# Patient Record
Sex: Male | Born: 1975 | Race: Black or African American | Hispanic: No | Marital: Single | Smoking: Never smoker
Health system: Southern US, Community
[De-identification: ages and names within clinical notes are randomized; demographics above are authoritative.]

## PROBLEM LIST (undated history)

## (undated) DIAGNOSIS — M7730 Calcaneal spur, unspecified foot: Secondary | ICD-10-CM

## (undated) DIAGNOSIS — L309 Dermatitis, unspecified: Secondary | ICD-10-CM

## (undated) HISTORY — DX: Dermatitis, unspecified: L30.9

## (undated) HISTORY — DX: Calcaneal spur, unspecified foot: M77.30

---

## 2003-02-04 ENCOUNTER — Emergency Department (HOSPITAL_COMMUNITY): Admission: EM | Admit: 2003-02-04 | Discharge: 2003-02-05 | Payer: Self-pay | Admitting: Emergency Medicine

## 2012-06-28 ENCOUNTER — Ambulatory Visit (INDEPENDENT_AMBULATORY_CARE_PROVIDER_SITE_OTHER): Payer: 59 | Admitting: Physician Assistant

## 2012-06-28 VITALS — BP 168/88 | HR 114 | Temp 98.3°F | Resp 16 | Ht 74.0 in | Wt 266.0 lb

## 2012-06-28 DIAGNOSIS — L708 Other acne: Secondary | ICD-10-CM

## 2012-06-28 DIAGNOSIS — L2089 Other atopic dermatitis: Secondary | ICD-10-CM

## 2012-06-28 DIAGNOSIS — L709 Acne, unspecified: Secondary | ICD-10-CM | POA: Insufficient documentation

## 2012-06-28 DIAGNOSIS — L730 Acne keloid: Secondary | ICD-10-CM | POA: Insufficient documentation

## 2012-06-28 DIAGNOSIS — L209 Atopic dermatitis, unspecified: Secondary | ICD-10-CM | POA: Insufficient documentation

## 2012-06-28 DIAGNOSIS — L910 Hypertrophic scar: Secondary | ICD-10-CM

## 2012-06-28 MED ORDER — TRIAMCINOLONE ACETONIDE 0.5 % EX OINT: TOPICAL_OINTMENT | Freq: Two times a day (BID) | CUTANEOUS | Status: AC

## 2012-06-28 MED ORDER — DOXYCYCLINE HYCLATE 100 MG PO CAPS: 100.0000 mg | ORAL_CAPSULE | Freq: Two times a day (BID) | ORAL | Status: AC

## 2012-06-28 NOTE — Patient Instructions (Signed)
Drink at least 64 ounces of water daily. Consider a humidifier for the room where you sleep. Bathe once daily. Avoid using HOT water, as it dries skin. Avoid deodorant soaps (Dial is the worst!) and stick with gentle cleansers (I like Cetaphil Liquid Cleanser). After bathing, dry off completely, then apply a thick emollient cream (I like Cetaphil Moisturizing Cream). Apply the cream twice daily, or more!  Take an oral antihistamine daily (Claritin or Allegra or Zyrtec are good choices)

## 2012-06-28 NOTE — Progress Notes (Signed)
  Subjective:    Patient ID: Jay Duran, male    DOB: Jun 23, 1976, 36 y.o.   MRN: [id number]  HPI This 36 y.o. Male presents for evaluation of a keloid on the back of his head and flare of eczema on the abdomen ("which it normally does this time of year").    The keloid has been present over 10 years. Injections were not beneficial. It has been getting larger, and now causes intermittent pain.  Ready to have it removed.  OTC cream for eczema was not helpful for the itchy rash on the abdomen.  Bathes TID (in the morning before work, in the afternoon after exercise, and then after the second part of his work day).  Doesn't use very hot water, uses Dove or [place] brand soap.  No moisturizer.  Review of Systems As above.  History reviewed. No pertinent past medical history.  History reviewed. No pertinent past surgical history.  Prior to Admission medications   Not on File    No Known Allergies  History   Social History  . Marital Status: Single    Spouse Name: N/A    Number of Children: 0  . Years of Education: 14   Occupational History  . Customer Service Representative    Social History Main Topics  . Smoking status: Never Smoker   . Smokeless tobacco: Never Used  . Alcohol Use: No  . Drug Use: No  . Sexually Active: Yes -- Male partner(s)    Birth Control/ Protection: Condom   Family History  Problem Relation Age of Onset  . Hypertension Mother   . Asthma Mother   . Hypertension Sister   . Hypertension Brother   . Hypertension Sister        Objective:   Physical Exam  Blood pressure 168/88, pulse 114, temperature 98.3 F (36.8 C), temperature source Oral, resp. rate 16, height 6\' 2"  (1.88 m), weight 266 lb (120.657 kg), SpO2 99.00%. Body mass index is 34.15 kg/(m^2). Well-developed, well nourished BM who is awake, alert and oriented, in NAD. HEENT: [place]/AT, sclera and conjunctiva are clear.   Neck: supple, non-tender, no lymphadenopathy,  thyromegaly. Heart: RRR, no murmur Lungs: CTA Skin: warm and dry. Posterior scalp with large keloid scattered with pustular lesions.  Mildly tender on palpation.  Trunk is covered with plaques or hyperpigmented scaled lesions, many of which are coalesced into very large lesions.  Pustulopapular acne also noted on the face.     Assessment & Plan:   1. Folliculitis keloidalis nuchae  doxycycline (VIBRAMYCIN) 100 MG capsule  2. Keloid of skin  Ambulatory referral to Plastic Surgery  3. Atopic eczema  triamcinolone ointment (KENALOG) 0.5 %   Patient Instructions  Drink at least 64 ounces of water daily. Consider a humidifier for the room where you sleep. Bathe once daily. Avoid using HOT water, as it dries skin. Avoid deodorant soaps (Dial is the worst!) and stick with gentle cleansers (I like Cetaphil Liquid Cleanser). After bathing, dry off completely, then apply a thick emollient cream (I like Cetaphil Moisturizing Cream). Apply the cream twice daily, or more!  Take an oral antihistamine daily (Claritin or Allegra or Zyrtec are good choices)  RTC 4 weeks, sooner prn Consider oral prednisone and/or referral to dermatology at that time.

## 2012-07-01 NOTE — Progress Notes (Signed)
Left message for pt to call to schedule 1 month f-up appt. Jay Duran

## 2012-07-09 NOTE — Progress Notes (Signed)
Sent pt reminder letter to schedule 1 month f-up from 06/28/12 visit.

## 2013-12-23 ENCOUNTER — Encounter: Payer: Self-pay | Admitting: Medical

## 2013-12-23 ENCOUNTER — Ambulatory Visit (INDEPENDENT_AMBULATORY_CARE_PROVIDER_SITE_OTHER): Payer: 59 | Admitting: Medical

## 2013-12-23 VITALS — BP 132/80 | HR 74 | Temp 98.1°F | Wt 262.0 lb

## 2013-12-23 DIAGNOSIS — M542 Cervicalgia: Secondary | ICD-10-CM

## 2013-12-23 DIAGNOSIS — M436 Torticollis: Secondary | ICD-10-CM

## 2013-12-23 MED ORDER — CYCLOBENZAPRINE HCL 10 MG PO TABS
ORAL_TABLET | ORAL | Status: DC
Start: 2013-12-23 — End: 2014-01-20

## 2013-12-23 MED ORDER — CELECOXIB 200 MG PO CAPS
200.0000 mg | ORAL_CAPSULE | Freq: Two times a day (BID) | ORAL | Status: DC
Start: 2013-12-23 — End: 2014-01-20

## 2013-12-23 NOTE — Progress Notes (Signed)
Subjective: Here as a new patient today for complaint of neck pain.  No recent primary care, has been in good state of health, no significant prior medical problems.  2 weeks ago he recalls rolling over in bed for a crackle in his neck and the next morning his neck was stiff. Since then he has had some neck pain and stiffness, some decreased range of motion. He denies arm pain, numbness, tingling, weakness, no fever, no recent illness, recent injury, trauma.  He does exercise, plays basketball. He has used some heat, ice pack, massage chair, and this has helped some. No other aggravating or relieving factors.  History reviewed. No pertinent past medical history.  Review of systems as in subjective:  Objective: General: Well-developed, well-nourished, alert, no acute distress Skin: No erythema or ecchymosis Neck:  Range of motion is somewhat decreased in all directions, mild posterior tenderness at the base of the neck, but no lymphadenopathy, no mass no thyromegaly Back: Nontender MSK: Upper extremities non-tender, no deformity, normal range of motion Upper extremities neurovascularly intact   Assessment: Encounter Diagnoses  Name Primary?  . Torticollis Yes  . Cervicalgia     Plan: Discussed his symptoms and exam suggest torticollis versus muscle spasm of the neck versus strain.  Gave a few samples of Celebrex to use for pain and inflammation.  Flexeril to use when necessary, discussed possible sedation. Discussed routine stretching and range of motion exercises. Consider massage, can use heat when necessary. Call or return if not improving within the next week.

## 2013-12-23 NOTE — Patient Instructions (Signed)
Diagnosis: neck strain vs neck spasm vs torticollis  Recommendations: For the next 1-2 weeks,  Use heat as needed   Celebrex 200mg  antiinflammatory, 1 daily with food in the morning  Begin muscle relaxer at bedtime for a few days, then as needed.  Flexeril 10mg , 1/2- 1 tablet at bedtime only  Consider massage  Do daily stretches as we discussed  This should resolve within a week or so.   If not resolving then let me know.  Massage Therapy:  Sharen HintJanet Blevins [facility] [address] [place], [place] [address] [phone] [email or web address]   [facility] [address] [place], [place] [id number] [phone]  Torticollis, Acute You have suddenly (acutely) developed a twisted neck (torticollis). This is usually a self-limited condition. CAUSES  Acute torticollis may be caused by malposition, trauma or infection. Most commonly, acute torticollis is caused by sleeping in an awkward position. Torticollis may also be caused by the flexion, extension or twisting of the neck muscles beyond their normal position. Sometimes, the exact cause may not be known. SYMPTOMS  Usually, there is pain and limited movement of the neck. Your neck may twist to one side. DIAGNOSIS  The diagnosis is often made by physical examination. X-rays, CT scans or MRIs may be done if there is a history of trauma or concern of infection. TREATMENT  For a common, stiff neck that develops during sleep, treatment is focused on relaxing the contracted neck muscle. Medications (including shots) may be used to treat the problem. Most cases resolve in several days. Torticollis usually responds to conservative physical therapy. If left untreated, the shortened and spastic neck muscle can cause deformities in the face and neck. Rarely, surgery is required. HOME CARE INSTRUCTIONS   Use over-the-counter and prescription medications as directed by your  caregiver.  Do stretching exercises and massage the neck as directed by your caregiver.  Follow up with physical therapy if needed and as directed by your caregiver. SEEK IMMEDIATE MEDICAL CARE IF:   You develop difficulty breathing or noisy breathing (stridor).  You drool, develop trouble swallowing or have pain with swallowing.  You develop numbness or weakness in the hands or feet.  You have changes in speech or vision.  You have problems with urination or bowel movements.  You have difficulty walking.  You have a fever.  You have increased pain. MAKE SURE YOU:   Understand these instructions.  Will watch your condition.  Will get help right away if you are not doing well or get worse. Document Released: 11/10/2000 Document Revised: 02/05/2012 Document Reviewed: 12/22/2009 [facility] Patient Information 2014 [place], [place].

## 2014-01-20 ENCOUNTER — Encounter: Payer: Self-pay | Admitting: Medical

## 2014-01-20 ENCOUNTER — Ambulatory Visit (INDEPENDENT_AMBULATORY_CARE_PROVIDER_SITE_OTHER): Payer: 59 | Admitting: Medical

## 2014-01-20 VITALS — BP 140/90 | HR 72 | Temp 98.1°F | Resp 18 | Ht 74.0 in | Wt 267.0 lb

## 2014-01-20 DIAGNOSIS — Q799 Congenital malformation of musculoskeletal system, unspecified: Secondary | ICD-10-CM

## 2014-01-20 DIAGNOSIS — M79676 Pain in unspecified toe(s): Secondary | ICD-10-CM

## 2014-01-20 DIAGNOSIS — K921 Melena: Secondary | ICD-10-CM

## 2014-01-20 DIAGNOSIS — R102 Pelvic and perineal pain: Secondary | ICD-10-CM

## 2014-01-20 DIAGNOSIS — R030 Elevated blood-pressure reading, without diagnosis of hypertension: Secondary | ICD-10-CM

## 2014-01-20 DIAGNOSIS — Z113 Encounter for screening for infections with a predominantly sexual mode of transmission: Secondary | ICD-10-CM

## 2014-01-20 DIAGNOSIS — K036 Deposits [accretions] on teeth: Secondary | ICD-10-CM

## 2014-01-20 DIAGNOSIS — M898X9 Other specified disorders of bone, unspecified site: Secondary | ICD-10-CM

## 2014-01-20 DIAGNOSIS — Z0000 Encounter for general adult medical examination without abnormal findings: Secondary | ICD-10-CM

## 2014-01-20 DIAGNOSIS — M79609 Pain in unspecified limb: Secondary | ICD-10-CM

## 2014-01-20 LAB — HIV ANTIBODY (ROUTINE TESTING W REFLEX): HIV: NONREACTIVE

## 2014-01-20 LAB — POCT URINALYSIS DIPSTICK
Bilirubin, UA: NEGATIVE
GLUCOSE UA: NEGATIVE
Ketones, UA: NEGATIVE
Leukocytes, UA: NEGATIVE
Nitrite, UA: NEGATIVE
RBC UA: NEGATIVE
SPEC GRAV UA: 1.025
UROBILINOGEN UA: NEGATIVE
pH, UA: 5

## 2014-01-20 LAB — CBC
HEMATOCRIT: 36 % — AB (ref 39.0–52.0)
HEMOGLOBIN: 11.9 g/dL — AB (ref 13.0–17.0)
MCH: 26.5 pg (ref 26.0–34.0)
MCHC: 33.1 g/dL (ref 30.0–36.0)
MCV: 80.2 fL (ref 78.0–100.0)
Platelets: 290 10*3/uL (ref 150–400)
RBC: 4.49 MIL/uL (ref 4.22–5.81)
RDW: 15.1 % (ref 11.5–15.5)
WBC: 5.8 10*3/uL (ref 4.0–10.5)

## 2014-01-20 LAB — COMPREHENSIVE METABOLIC PANEL
ALBUMIN: 4 g/dL (ref 3.5–5.2)
ALK PHOS: 50 U/L (ref 39–117)
ALT: 16 U/L (ref 0–53)
AST: 17 U/L (ref 0–37)
BILIRUBIN TOTAL: 0.3 mg/dL (ref 0.2–1.2)
BUN: 9 mg/dL (ref 6–23)
CO2: 28 mEq/L (ref 19–32)
CREATININE: 0.77 mg/dL (ref 0.50–1.35)
Calcium: 8.8 mg/dL (ref 8.4–10.5)
Chloride: 103 mEq/L (ref 96–112)
GLUCOSE: 87 mg/dL (ref 70–99)
POTASSIUM: 4 meq/L (ref 3.5–5.3)
Sodium: 139 mEq/L (ref 135–145)
Total Protein: 7.6 g/dL (ref 6.0–8.3)

## 2014-01-20 LAB — LIPID PANEL
CHOL/HDL RATIO: 3.6 ratio
Cholesterol: 141 mg/dL (ref 0–200)
HDL: 39 mg/dL — AB (ref >39)
LDL CALC: 90 mg/dL (ref 0–99)
TRIGLYCERIDES: 61 mg/dL (ref <150)
VLDL: 12 mg/dL (ref 0–40)

## 2014-01-20 LAB — RPR

## 2014-01-20 LAB — TSH: TSH: 1.368 u[IU]/mL (ref 0.350–4.500)

## 2014-01-20 NOTE — Patient Instructions (Signed)
Thank you for giving me the opportunity to serve you today.    Your diagnosis today includes: Encounter Diagnoses  Name Primary?  . Routine general medical examination at a health care facility Yes  . Screen for STD (sexually transmitted disease)   . Blood in stool   . Dental plaque   . Perineal pain   . Great toe pain   . Bony growth   . Elevated blood pressure reading without diagnosis of hypertension      Specific recommendations today include:  We are checking routine labs today, and we will call with lab results  The recent blood in the stool is likely from internal hemorrhoids, but can't rule out other sources.  If you continue to get blood in the stools then we will need to recheck  Dental plaque-followup with dentist for routine hygiene visit  Perineal pain and abnormality-use salt water soaks, and let the shower water flush this area to keep it clean.  Lets plan to recheck on this in 2-3 weeks. However if you develop fever, worse pain or swelling in the perineal area, worse drainage, then return right away  Go for x-ray of the feet to evaluate the bony growth  We will continue to monitor your blood pressure. For now work on eating a healthy diet, exercising, and trying to lose weight.  Follow up: 2-3 wk   I have included other useful information below for your review. DASH Diet The DASH diet stands for "Dietary Approaches to Stop Hypertension." It is a healthy eating plan that has been shown to reduce high blood pressure (hypertension) in as little as 14 days, while also possibly providing other significant health benefits. These other health benefits include reducing the risk of breast cancer after menopause and reducing the risk of type 2 diabetes, heart disease, colon cancer, and stroke. Health benefits also include weight loss and slowing kidney failure in patients with chronic kidney disease.  DIET GUIDELINES  Limit salt (sodium). Your diet should contain less  than 1500 mg of sodium daily.  Limit refined or processed carbohydrates. Your diet should include mostly whole grains. Desserts and added sugars should be used sparingly.  Include small amounts of heart-healthy fats. These types of fats include nuts, oils, and tub margarine. Limit saturated and trans fats. These fats have been shown to be harmful in the body. CHOOSING FOODS  The following food groups are based on a 2000 calorie diet. See your Registered Dietitian for individual calorie needs. Grains and Grain Products (6 to 8 servings daily)  Eat More Often: Whole-wheat bread, brown rice, whole-grain or wheat pasta, quinoa, popcorn without added fat or salt (air popped).  Eat Less Often: White bread, white pasta, white rice, cornbread. Vegetables (4 to 5 servings daily)  Eat More Often: Fresh, frozen, and canned vegetables. Vegetables may be raw, steamed, roasted, or grilled with a minimal amount of fat.  Eat Less Often/Avoid: Creamed or fried vegetables. Vegetables in a cheese sauce. Fruit (4 to 5 servings daily)  Eat More Often: All fresh, canned (in natural juice), or frozen fruits. Dried fruits without added sugar. One hundred percent fruit juice ( cup [237 mL] daily).  Eat Less Often: Dried fruits with added sugar. Canned fruit in light or heavy syrup. [employer], Fish, and Poultry (2 servings or less daily. One serving is 3 to 4 oz [85-114 g]).  Eat More Often: Ninety percent or leaner ground beef, tenderloin, sirloin. Round cuts of beef, chicken breast, [place] breast.  All fish. Grill, bake, or broil your meat. Nothing should be fried.  Eat Less Often/Avoid: Fatty cuts of meat, [place], or chicken leg, thigh, or wing. Fried cuts of meat or fish. Dairy (2 to 3 servings)  Eat More Often: Low-fat or fat-free milk, low-fat plain or light yogurt, reduced-fat or part-skim cheese.  Eat Less Often/Avoid: Milk (whole, 2%).Whole milk yogurt. Full-fat cheeses. Nuts, Seeds, and Legumes (4  to 5 servings per week)  Eat More Often: All without added salt.  Eat Less Often/Avoid: Salted nuts and seeds, canned beans with added salt. Fats and Sweets (limited)  Eat More Often: Vegetable oils, tub margarines without trans fats, sugar-free gelatin. Mayonnaise and salad dressings.  Eat Less Often/Avoid: Coconut oils, palm oils, butter, stick margarine, cream, half and half, cookies, candy, pie. FOR MORE INFORMATION The Dash Diet Eating Plan: www.dashdiet.org Document Released: 11/02/2011 Document Revised: 02/05/2012 Document Reviewed: 11/02/2011 [facility] Patient Information 2014 [place], [place].   Hypertension As your heart beats, it forces blood through your arteries. This force is your blood pressure. If the pressure is too high, it is called hypertension (HTN) or high blood pressure. HTN is dangerous because you may have it and not know it. High blood pressure may mean that your heart has to work harder to pump blood. Your arteries may be narrow or stiff. The extra work puts you at risk for heart disease, stroke, and other problems.  Blood pressure consists of two numbers, a higher number over a lower, 110/72, for example. It is stated as "110 over 72." The ideal is below 120 for the top number (systolic) and under 80 for the bottom (diastolic). Write down your blood pressure today. You should pay close attention to your blood pressure if you have certain conditions such as:  Heart failure.  Prior heart attack.  Diabetes  Chronic kidney disease.  Prior stroke.  Multiple risk factors for heart disease. To see if you have HTN, your blood pressure should be measured while you are seated with your arm held at the level of the heart. It should be measured at least twice. A one-time elevated blood pressure reading (especially in the Emergency Department) does not mean that you need treatment. There may be conditions in which the blood pressure is different between your right and  left arms. It is important to see your caregiver soon for a recheck. Most people have essential hypertension which means that there is not a specific cause. This type of high blood pressure may be lowered by changing lifestyle factors such as:  Stress.  Smoking.  Lack of exercise.  Excessive weight.  Drug/tobacco/alcohol use.  Eating less salt. Most people do not have symptoms from high blood pressure until it has caused damage to the body. Effective treatment can often prevent, delay or reduce that damage. TREATMENT  When a cause has been identified, treatment for high blood pressure is directed at the cause. There are a large number of medications to treat HTN. These fall into several categories, and your caregiver will help you select the medicines that are best for you. Medications may have side effects. You should review side effects with your caregiver. If your blood pressure stays high after you have made lifestyle changes or started on medicines,   Your medication(s) may need to be changed.  Other problems may need to be addressed.  Be certain you understand your prescriptions, and know how and when to take your medicine.  Be sure to follow up with your  caregiver within the time frame advised (usually within two weeks) to have your blood pressure rechecked and to review your medications.  If you are taking more than one medicine to lower your blood pressure, make sure you know how and at what times they should be taken. Taking two medicines at the same time can result in blood pressure that is too low. SEEK IMMEDIATE MEDICAL CARE IF:  You develop a severe headache, blurred or changing vision, or confusion.  You have unusual weakness or numbness, or a faint feeling.  You have severe chest or abdominal pain, vomiting, or breathing problems. MAKE SURE YOU:   Understand these instructions.  Will watch your condition.  Will get help right away if you are not doing well or  get worse. Document Released: 11/13/2005 Document Revised: 02/05/2012 Document Reviewed: 07/03/2008 [facility] Patient Information 2014 [place], [place].

## 2014-01-20 NOTE — Progress Notes (Signed)
Subjective:   HPI  Jay Duran is a 38 y.o. male who presents for a complete physical.  He recently established here as a patient, had episode of torticollis which has totally resolved.    Medical care team includes:  [facility]  Opthalmology  Dentist  Kristian Covey, PA-C here for primary care   Preventative care: Last ophthalmology visit: 2012  Last dental visit: 2014 Last colonoscopy: no Last prostate exam: no Last EKG: no Last labs: no  Prior vaccinations: TD or Tdap: 2009 Influenza: no Pneumococcal: no Shingles/Zostavax: no  Advanced directive: Health care power of attorney:  Living will: yes  Concerns: Seepage/fluid from the perineum x 2-3 wk, no prior similar.  Has had occasional blood in stool x 2 episodes, BRBPR.    Bony changes of right great toe the last few months.  Reviewed their medical, surgical, family, social, medication, and allergy history and updated chart as appropriate.  Past Medical History  Diagnosis Date  . Eczema     sees [facility]    History reviewed. No pertinent past surgical history.  History   Social History  . Marital Status: Single    Spouse Name: N/A    Number of Children: 0  . Years of Education: 14   Occupational History  . Customer Service Representative    Social History Main Topics  . Smoking status: Never Smoker   . Smokeless tobacco: Never Used  . Alcohol Use: No  . Drug Use: No  . Sexual Activity: Yes    Partners: Male    Birth Control/ Protection: Condom   Other Topics Concern  . Not on file   Social History Narrative   Single, dating, no children, works in Clinical biochemist at [employer], exercise - basketball, cycling, boxing sometimes.      Family History  Problem Relation Age of Onset  . Hypertension Mother   . Asthma Mother   . COPD Mother   . Hypertension Sister   . Hypertension Brother   . Hypertension Sister   . Cancer Sister     breast  . Kidney disease Father    . Diabetes Father     Current outpatient prescriptions:DOXYCYCLINE CALCIUM PO, Take by mouth., Disp: , Rfl: ;  celecoxib (CELEBREX) 200 MG capsule, Take 1 capsule (200 mg total) by mouth 2 (two) times daily., Disp: 9 capsule, Rfl: 0;  cyclobenzaprine (FLEXERIL) 10 MG tablet, 1/2- 1 tablet po QHS, Disp: 15 tablet, Rfl: 0  No Known Allergies    Review of Systems Constitutional: -fever, -chills, -sweats, -unexpected weight change, -decreased appetite, -fatigue Allergy: -sneezing, -itching, -congestion Dermatology: -changing moles, --rash, -lumps ENT: -runny nose, -ear pain, -sore throat, -hoarseness, -sinus pain, -teeth pain, - ringing in ears, -hearing loss, -nosebleeds Cardiology: -chest pain, -palpitations, -swelling, -difficulty breathing when lying flat, -waking up short of breath Respiratory: -cough, -shortness of breath, -difficulty breathing with exercise or exertion, -wheezing, -coughing up blood Gastroenterology: -abdominal pain, -nausea, -vomiting, -diarrhea, -constipation, -blood in stool, -changes in bowel movement, -difficulty swallowing or eating Hematology: -bleeding, -bruising  Musculoskeletal: -joint aches, -muscle aches, -joint swelling, -back pain, -neck pain, -cramping, -changes in gait Ophthalmology: denies vision changes, eye redness, itching, discharge Urology: -burning with urination, -difficulty urinating, -blood in urine, -urinary frequency, -urgency, -incontinence Neurology: -headache, -weakness, -tingling, -numbness, -memory loss, -falls, -dizziness Psychology: -depressed mood, -agitation, -sleep problems     Objective:   Physical Exam  BP 140/90  Pulse 72  Temp(Src) 98.1 F (36.7 C) (Oral)  Resp 18  Ht 6\' 2"  (1.88 m)  Wt 267 lb (121.11 kg)  BMI 34.27 kg/m2  General appearance: alert, no distress, WD/WN, overweight AA male Skin: Of note, followed by dermatology.  Large patches of rough dark brown skin on chest and abdomen, comprising at least 50% of  his anterior torso, very large contiguous dark brown rough patch of skin of his upper mid and lower back comprising 70% of his posterior torso, face in general with darker brown rough skin with some small nodular scarring, several other small brown patches of rough skin on extremities upper and lower HEENT: normocephalic, conjunctiva/corneas normal, sclerae anicteric, PERRLA, EOMi, nares patent, no discharge or erythema, pharynx normal Oral cavity: MMM, tongue normal, teeth with moderate plaque, otherwise in good repair Neck: supple, no lymphadenopathy, no thyromegaly, no masses, normal ROM, no bruits Chest: non tender, normal shape and expansion Heart: RRR, normal S1, S2, no murmurs Lungs: CTA bilaterally, no wheezes, rhonchi, or rales Abdomen: +bs, soft, non tender, left lower abdomen with fullness which is likely stool, otherwise non distended, no masses, no hepatomegaly, no splenomegaly, no bruits Back: non tender, normal ROM, no scoliosis Musculoskeletal: dorsal right great toe at the MTP with bony growth, likely arthritic change versus bunion, bilateral second toes with bony raised growth overlying the mid to distal tarsal bones nontender and unchanged for years, upper extremities non tender, no obvious deformity, normal ROM throughout, lower extremities non tender, no obvious deformity, normal ROM throughout Extremities: no edema, no cyanosis, no clubbing Pulses: 2+ symmetric, upper and lower extremities, normal cap refill Neurological: alert, oriented x 3, CN2-12 intact, strength normal upper extremities and lower extremities, sensation normal throughout, DTRs 2+ throughout, no cerebellar signs, gait normal Psychiatric: normal affect, behavior normal, pleasant  GU: normal male external genitalia, circumcised, nontender, no masses, no hernia, no lymphadenopathy, there are a few small nodular scars in the pubic region from prior boils Rectal:  There is a round 1 cm hole/opening in the perineal  region, a 2 cm linear furrow, but this area isn't particularly tender, there is some white to yellow mucoid drainage from this area, no obvious induration or warmth suggestive of an abscess, but no obvious fistula either, anus normal tone, no obvious abnormality, no obvious fistula connection, prostate within normal limits, occult negative stool   Assessment and Plan :    Encounter Diagnoses  Name Primary?  . Routine general medical examination at a health care facility Yes  . Screen for STD (sexually transmitted disease)   . Blood in stool   . Dental plaque   . Perineal pain   . Great toe pain   . Bony growth   . Elevated blood pressure reading without diagnosis of hypertension     Physical exam - discussed healthy lifestyle, diet, exercise, preventative care, vaccinations, and addressed their concerns.   Routine labs and STD screening today Blood in stool, perineal abnormality - I had Dr. Susann GivensLalonde supervising physician examining him as well. No obvious rectal fistula, may just be healing abscess versus fistula versus fissure.  We advised sits baths, good hygiene, recheck in 2-3 weeks Great toe pain bony growth-send for x-rays of bilateral feet Elevated blood pressure-advise weight loss, decrease salt in diet, DASH diet, exercise, and recheck blood pressure when he returns  Return pending labs.

## 2014-01-21 LAB — GC/CHLAMYDIA PROBE AMP
CT PROBE, AMP APTIMA: NEGATIVE
GC Probe RNA: NEGATIVE

## 2014-01-21 NOTE — Progress Notes (Signed)
LMOM TO CB. CLS 

## 2014-01-28 ENCOUNTER — Ambulatory Visit
Admission: RE | Admit: 2014-01-28 | Discharge: 2014-01-28 | Disposition: A | Discharge status: Home / Self Care | Payer: 59 | Source: Ambulatory Visit | Attending: Medical | Admitting: Medical

## 2014-01-28 DIAGNOSIS — M898X9 Other specified disorders of bone, unspecified site: Secondary | ICD-10-CM

## 2014-01-28 DIAGNOSIS — M79676 Pain in unspecified toe(s): Secondary | ICD-10-CM

## 2014-01-30 NOTE — Progress Notes (Signed)
LMOM TO CB. CLS 

## 2014-02-10 ENCOUNTER — Telehealth: Payer: Self-pay | Admitting: Internal Medicine

## 2014-02-10 ENCOUNTER — Ambulatory Visit (INDEPENDENT_AMBULATORY_CARE_PROVIDER_SITE_OTHER): Payer: 59 | Admitting: Medical

## 2014-02-10 ENCOUNTER — Encounter: Payer: Self-pay | Admitting: Medical

## 2014-02-10 VITALS — BP 126/76 | HR 78 | Temp 98.2°F | Resp 14 | Wt 266.0 lb

## 2014-02-10 DIAGNOSIS — M79609 Pain in unspecified limb: Secondary | ICD-10-CM

## 2014-02-10 DIAGNOSIS — D649 Anemia, unspecified: Secondary | ICD-10-CM

## 2014-02-10 DIAGNOSIS — R030 Elevated blood-pressure reading, without diagnosis of hypertension: Secondary | ICD-10-CM

## 2014-02-10 DIAGNOSIS — M79674 Pain in right toe(s): Secondary | ICD-10-CM

## 2014-02-10 DIAGNOSIS — M79675 Pain in left toe(s): Secondary | ICD-10-CM

## 2014-02-10 DIAGNOSIS — R102 Pelvic and perineal pain: Secondary | ICD-10-CM

## 2014-02-10 DIAGNOSIS — E786 Lipoprotein deficiency: Secondary | ICD-10-CM

## 2014-02-10 DIAGNOSIS — K921 Melena: Secondary | ICD-10-CM

## 2014-02-10 NOTE — Telephone Encounter (Signed)
[facility] for [facility] to set up pt appt

## 2014-02-10 NOTE — Telephone Encounter (Signed)
Message copied by Joslyn HyLOWE, Spruha Weight A on Tue Feb 10, 2014  9:11 AM ------      Message from: Aleen CampiYSINGER, DAVID S      Created: Tue Feb 10, 2014  9:00 AM       Referred to Gen. surgery for perineal abnormality, possible fistula ------

## 2014-02-10 NOTE — Progress Notes (Signed)
   Subjective:    Milus BanisterJames H Lapenna is a 38 y.o. male presenting on 02/10/2014 with Follow-up  I saw him recently for physical.  He is here to recheck on labs and xrays, other concerns.  He is here for recheck on the perineal abnormality which is a little better but not healed up completely. There is still pus drainage, mild pain.  He has been doing sitz bath as we instructed.  No other complaint.  Review of Systems ROS as in subjective      Objective:     Filed Vitals:   02/10/14 0815  BP: 126/76  Pulse: 78  Temp: 98.2 F (36.8 C)  Resp: 14    General appearance: alert, no distress, WD/WN There is still a 7 mm round opening in the perineal area with mild purulent drainage, the fissured area seems to be improved that was there last time     Assessment: Encounter Diagnoses  Name Primary?  . Perineal pain Yes  . Blood in stool   . Elevated blood pressure reading without diagnosis of hypertension   . Mild anemia   . Low HDL (under 40)   . Toe pain, bilateral      Plan: We discussed his recent physical visit, abnormal labs, x-rays of the feet.  Perineal pain, blood in stool, perineal abnormality-referral to general surgery  Elevated blood pressure on last visit, much improved today.  We discussed his recent lab abnormalities including mild anemia, low HDL. I recommended routine exercise, weight loss, healthy diet, low-cholesterol diet.  Toe pain-he was seen by orthopedics yesterday and will be having physical therapy  Fayrene FearingJames was seen today for follow-up.  Diagnoses and associated orders for this visit:  Perineal pain  Blood in stool  Elevated blood pressure reading without diagnosis of hypertension  Mild anemia  Low HDL (under 40)  Toe pain, bilateral      Return pending call back.

## 2014-02-11 ENCOUNTER — Encounter (INDEPENDENT_AMBULATORY_CARE_PROVIDER_SITE_OTHER): Payer: 59 | Admitting: General Surgery

## 2014-02-26 ENCOUNTER — Ambulatory Visit (INDEPENDENT_AMBULATORY_CARE_PROVIDER_SITE_OTHER): Payer: 59 | Admitting: General Surgery

## 2014-02-26 ENCOUNTER — Encounter (INDEPENDENT_AMBULATORY_CARE_PROVIDER_SITE_OTHER): Payer: Self-pay | Admitting: General Surgery

## 2014-02-26 VITALS — BP 146/80 | HR 70 | Temp 97.1°F | Resp 14 | Ht 74.0 in | Wt 264.6 lb

## 2014-02-26 DIAGNOSIS — K603 Anal fistula: Secondary | ICD-10-CM

## 2014-02-26 MED ORDER — METRONIDAZOLE 500 MG PO TABS: 500.0000 mg | ORAL_TABLET | Freq: Three times a day (TID) | ORAL | Status: AC

## 2014-02-26 NOTE — Progress Notes (Signed)
Patient ID: Jay BanisterJames H Dotts, male   DOB: 1975-12-18, 38 y.o.   MRN: [id number]  Chief Complaint  Patient presents with  . New Evaluation    eval perianal abnormality    HPI Jay Duran is a 38 y.o. male.  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 Patient states she's notices there for approximately 2 months. He states that he noticed some blood which he thinks may be from hemorrhoids. He currently takes Benefiber his fiber intake. Upon removing the progress notes it seemed that the patient had a small punctate opening anterior to the anus the perineal area. There was small amount purulence that was noted on the last visit.  HPI  Past Medical History  Diagnosis Date  . Eczema     sees [facility]    History reviewed. No pertinent past surgical history.  Family History  Problem Relation Age of Onset  . Hypertension Mother   . Asthma Mother   . COPD Mother   . Hypertension Sister   . Hypertension Brother   . Hypertension Sister   . Cancer Sister     breast  . Kidney disease Father   . Diabetes Father     Social History History  Substance Use Topics  . Smoking status: Never Smoker   . Smokeless tobacco: Never Used  . Alcohol Use: No    No Known Allergies  Current Outpatient Prescriptions  Medication Sig Dispense Refill  . clobetasol ointment (TEMOVATE) 0.05 %       . DOXYCYCLINE CALCIUM PO Take by mouth.      . minocycline (MINOCIN,DYNACIN) 50 MG capsule        No current facility-administered medications for this visit.    Review of Systems Review of Systems  Constitutional: Negative.   HENT: Negative.   Eyes: Negative.   Respiratory: Negative.   Cardiovascular: Negative.   Gastrointestinal: Positive for abdominal pain and anal bleeding.  Endocrine: Negative.   Neurological: Negative.     Blood pressure 146/80, pulse 70, temperature 97.1 F (36.2 C), resp. rate 14, height 6\' 2"  (1.88  m), weight 264 lb 9.6 oz (120.022 kg).  Physical Exam Physical Exam  Constitutional: He is oriented to person, place, and time. He appears well-developed and well-nourished.  HENT:  Head: Normocephalic and atraumatic.  Eyes: Conjunctivae and EOM are normal. Pupils are equal, round, and reactive to light.  Neck: Normal range of motion. Neck supple.  Cardiovascular: Normal rate, regular rhythm and normal heart sounds.   Pulmonary/Chest: Effort normal and breath sounds normal.  Abdominal: Soft. Bowel sounds are normal. He exhibits no distension and no mass. There is no tenderness. There is no rebound and no guarding.  Genitourinary:     Small punctate area, no purulence, no blood   Musculoskeletal: Normal range of motion.  Neurological: He is alert and oriented to person, place, and time.  Skin: Skin is warm and dry.    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 2. We'll have patient follow back up in 2 weeks. I discussed with him that if this is not better in this time we did talk about proceeding to the operating room for an exam under anesthesia and likely seton placement versus fistulotomy. Patient agrees with this plan       Derrell Lollingamirez  Aletta Edouard 02/26/2014, 9:12 AM

## 2014-03-12 ENCOUNTER — Encounter (INDEPENDENT_AMBULATORY_CARE_PROVIDER_SITE_OTHER): Payer: 59 | Admitting: General Surgery

## 2014-03-30 ENCOUNTER — Ambulatory Visit (INDEPENDENT_AMBULATORY_CARE_PROVIDER_SITE_OTHER): Payer: 59 | Admitting: General Surgery

## 2014-03-30 ENCOUNTER — Encounter (INDEPENDENT_AMBULATORY_CARE_PROVIDER_SITE_OTHER): Payer: Self-pay | Admitting: General Surgery

## 2014-03-30 VITALS — BP 124/80 | HR 77 | Temp 97.5°F | Resp 16 | Ht 74.0 in | Wt 264.4 lb

## 2014-03-30 DIAGNOSIS — K603 Anal fistula: Secondary | ICD-10-CM

## 2014-03-30 NOTE — Progress Notes (Signed)
Patient ID: Jay Duran, male   DOB: October 02, 1976, 38 y.o.   MRN: [id number]  Chief Complaint  Patient presents with  . anal fistula    HPI Jay BanisterJames H Hillmann is a 38 y.o. male.  The patient is a 38 year old male who was present seen for a possible perianal fistula. The patient states that the drainage has somewhat decreased however there is still some mucousy drainage. He's noticed or blood.  HPI  Past Medical History  Diagnosis Date  . Eczema     sees [facility]    History reviewed. No pertinent past surgical history.  Family History  Problem Relation Age of Onset  . Hypertension Mother   . Asthma Mother   . COPD Mother   . Hypertension Sister   . Hypertension Brother   . Hypertension Sister   . Cancer Sister     breast  . Kidney disease Father   . Diabetes Father     Social History History  Substance Use Topics  . Smoking status: Never Smoker   . Smokeless tobacco: Never Used  . Alcohol Use: No    No Known Allergies  Current Outpatient Prescriptions  Medication Sig Dispense Refill  . clobetasol ointment (TEMOVATE) 0.05 %        No current facility-administered medications for this visit.    Review of Systems Review of Systems  Constitutional: Negative.   HENT: Negative.   Eyes: Negative.   Respiratory: Negative.   Cardiovascular: Negative.   Gastrointestinal: Negative.   Endocrine: Negative.   Neurological: Negative.     Blood pressure 124/80, pulse 77, temperature 97.5 F (36.4 C), resp. rate 16, height 6\' 2"  (1.88 m), weight 264 lb 6.4 oz (119.931 kg).  Physical Exam Physical Exam  Constitutional: He is oriented to person, place, and time. He appears well-developed and well-nourished.  HENT:  Head: Normocephalic and atraumatic.  Eyes: Conjunctivae and EOM are normal. Pupils are equal, round, and reactive to light.  Neck: Normal range of motion. Neck supple.  Cardiovascular: Normal rate, regular rhythm and normal heart sounds.     Pulmonary/Chest: Effort normal and breath sounds normal.  Genitourinary:     Musculoskeletal: Normal range of motion.  Neurological: He is alert and oriented to person, place, and time.  Skin: Skin is warm and dry.    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 We'll proceed to the operating room for exam under anesthesia, possible fistulotomy, possible seton placement.  2. Discussed the possible risks of the procedure to include but not limited to: Infection, bleeding, possible incontinence and possible recurrence. The patient voiced understanding and wishes to proceed.       Axel Fillerrmando Jauna Raczynski 03/30/2014, 9:57 AM

## 2015-03-08 ENCOUNTER — Encounter: Payer: Self-pay | Admitting: Pulmonary Disease

## 2015-03-08 ENCOUNTER — Ambulatory Visit (INDEPENDENT_AMBULATORY_CARE_PROVIDER_SITE_OTHER): Payer: 59 | Admitting: Pulmonary Disease

## 2015-03-08 VITALS — BP 122/72 | HR 92 | Temp 97.7°F | Ht 74.0 in | Wt 280.2 lb

## 2015-03-08 DIAGNOSIS — G4733 Obstructive sleep apnea (adult) (pediatric): Secondary | ICD-10-CM | POA: Insufficient documentation

## 2015-03-08 DIAGNOSIS — R0683 Snoring: Secondary | ICD-10-CM | POA: Diagnosis not present

## 2015-03-08 NOTE — Assessment & Plan Note (Signed)
The patient has been noted to have loud snoring, but it is unclear if he has obstructive sleep apnea. He certainly is at risk with his excessive weight, large neck, and abnormal upper airway anatomy. He would like to consider aggressive treatment of snoring, but in order to be considered a candidate, he needs to have a sleep study to make sure that he does not have significant sleep disordered breathing. I think he is an excellent candidate for home sleep testing, and the patient is agreeable to this approach. I have asked him to consider aggressive weight loss, as well as positional therapy during sleep.

## 2015-03-08 NOTE — Patient Instructions (Signed)
Will arrange for home sleep testing. Work on weight loss, and staying off your back during sleep Will call once results are available.

## 2015-03-08 NOTE — Progress Notes (Signed)
   Subjective:    Patient ID: Jay Duran, male    DOB: 15-Jun-1976, 39 y.o.   MRN: [id number]  HPI The patient is a 39 year old male who comes in today as a self-referral for possible obstructive sleep apnea. He has been noted to have loud snoring by his bed partner, but has never been told that he has an abnormal breathing pattern during sleep. He has very few awakenings during the night, and feels that he is rested in the mornings upon arising. He denies any significant inappropriate daytime sleepiness, but can get sleepy at night while sitting in his chair and watching television. He denies any sleepiness while driving. The patient's weight is up 20 pounds over the last 2 years, and his Epworth score today is 4.   Sleep Questionnaire What time do you typically go to bed?( Between what hours) 9p-10p 9p-10p at 1435 on 03/08/15 by Tommie SamsMindy S Silva, CMA How long does it take you to fall asleep? 15 min 15 min at 1435 on 03/08/15 by Tommie SamsMindy S Silva, CMA How many times during the night do you wake up? 1 1 at 1435 on 03/08/15 by Tommie SamsMindy S Silva, CMA What time do you get out of bed to start your day? 0500 0500 at 1435 on 03/08/15 by Tommie SamsMindy S Silva, CMA Do you drive or operate heavy machinery in your occupation? No No at 1435 on 03/08/15 by Tommie SamsMindy S Silva, CMA How much has your weight changed (up or down) over the past two years? (In pounds) 20 lb (9.072 kg) 20 lb (9.072 kg) at 1435 on 03/08/15 by Tommie SamsMindy S Silva, CMA Have you ever had a sleep study before? No No at 1435 on 03/08/15 by Tommie SamsMindy S Silva, CMA Do you currently use CPAP? No No at 1435 on 03/08/15 by Tommie SamsMindy S Silva, CMA Do you wear oxygen at any time? No No at 1435 on 03/08/15 by Tommie SamsMindy S Silva, CMA   Review of Systems  Constitutional: Negative for fever, chills, activity change, appetite change and unexpected weight change.  HENT: Negative for congestion, dental problem, postnasal drip, rhinorrhea, sneezing, sore throat, trouble  swallowing and voice change.   Eyes: Negative for visual disturbance.  Respiratory: Negative for cough, choking and shortness of breath.   Cardiovascular: Negative for chest pain and leg swelling.  Gastrointestinal: Negative for nausea, vomiting and abdominal pain.  Genitourinary: Negative for difficulty urinating.  Musculoskeletal: Negative for arthralgias.  Skin: Negative for rash.  Psychiatric/Behavioral: Negative for behavioral problems and confusion.       Objective:   Physical Exam Constitutional:  Overweight male, no acute distress  HENT:  Nares patent without discharge  Oropharynx without exudate, palate and uvula thickened and elongated, +side wall narrowing  Eyes:  Perrla, eomi, no scleral icterus  Neck:  No JVD, no TMG  Cardiovascular:  Normal rate, regular rhythm, no rubs or gallops.  No murmurs        Intact distal pulses  Pulmonary :  Normal breath sounds, no stridor or respiratory distress   No rales, rhonchi, or wheezing  Abdominal:  Soft, nondistended, bowel sounds present.  No tenderness noted.   Musculoskeletal:  No lower extremity edema noted.  Lymph Nodes:  No cervical lymphadenopathy noted  Skin:  No cyanosis noted  Neurologic:  Alert, appropriate, moves all 4 extremities without obvious deficit.         Assessment & Plan:

## 2015-03-29 DIAGNOSIS — G4730 Sleep apnea, unspecified: Secondary | ICD-10-CM | POA: Diagnosis not present

## 2015-03-30 ENCOUNTER — Other Ambulatory Visit: Payer: Self-pay | Admitting: Orthopaedic Surgery

## 2015-03-30 DIAGNOSIS — M25572 Pain in left ankle and joints of left foot: Secondary | ICD-10-CM

## 2015-03-30 DIAGNOSIS — M25571 Pain in right ankle and joints of right foot: Secondary | ICD-10-CM

## 2015-04-05 ENCOUNTER — Ambulatory Visit
Admission: RE | Admit: 2015-04-05 | Discharge: 2015-04-05 | Disposition: A | Discharge status: Home / Self Care | Payer: 59 | Source: Ambulatory Visit | Attending: Orthopaedic Surgery | Admitting: Orthopaedic Surgery

## 2015-04-05 DIAGNOSIS — M25571 Pain in right ankle and joints of right foot: Secondary | ICD-10-CM

## 2015-04-05 DIAGNOSIS — M25572 Pain in left ankle and joints of left foot: Secondary | ICD-10-CM

## 2015-04-08 DIAGNOSIS — G4730 Sleep apnea, unspecified: Secondary | ICD-10-CM | POA: Diagnosis not present

## 2015-04-09 ENCOUNTER — Telehealth: Payer: Self-pay | Admitting: Pulmonary Disease

## 2015-04-09 NOTE — Telephone Encounter (Signed)
lmtcb

## 2015-04-09 NOTE — Telephone Encounter (Signed)
Needs ov to review sleep study 

## 2015-04-12 ENCOUNTER — Other Ambulatory Visit: Payer: Self-pay | Admitting: *Deleted

## 2015-04-12 DIAGNOSIS — R0683 Snoring: Secondary | ICD-10-CM

## 2015-04-13 NOTE — Telephone Encounter (Signed)
lmomtcb x1 

## 2015-04-14 NOTE — Telephone Encounter (Signed)
Pt returned call - [phone]

## 2015-04-14 NOTE — Telephone Encounter (Signed)
lmomtcb  

## 2015-04-14 NOTE — Telephone Encounter (Signed)
Called pt and appt scheduled for 5/31 at 2:45

## 2015-04-27 ENCOUNTER — Ambulatory Visit: Payer: 59 | Admitting: Pulmonary Disease

## 2015-04-29 ENCOUNTER — Encounter: Payer: Self-pay | Admitting: Pulmonary Disease

## 2015-04-29 ENCOUNTER — Ambulatory Visit (INDEPENDENT_AMBULATORY_CARE_PROVIDER_SITE_OTHER): Payer: 59 | Admitting: Pulmonary Disease

## 2015-04-29 VITALS — BP 122/64 | HR 79 | Temp 97.8°F | Ht 74.0 in | Wt 281.0 lb

## 2015-04-29 DIAGNOSIS — G4733 Obstructive sleep apnea (adult) (pediatric): Secondary | ICD-10-CM

## 2015-04-29 NOTE — Progress Notes (Signed)
   Subjective:    Patient ID: Milus BanisterJames H Hu, male    DOB: Sep 04, 1976, 39 y.o.   MRN: [id number]  HPI The patient comes in today for follow-up of his recent home sleep test. He was found to have very mild obstructive sleep apnea, with an AHI of 6 events per hour and no significant desaturation. Reviewed the study with him in detail, and answered all of his questions.   Review of Systems  Constitutional: Negative for fever and unexpected weight change.  HENT: Negative for congestion, dental problem, ear pain, nosebleeds, postnasal drip, rhinorrhea, sinus pressure, sneezing, sore throat and trouble swallowing.   Eyes: Negative for redness and itching.  Respiratory: Negative for cough, chest tightness, shortness of breath and wheezing.   Cardiovascular: Negative for palpitations and leg swelling.  Gastrointestinal: Negative for nausea and vomiting.  Genitourinary: Negative for dysuria.  Musculoskeletal: Negative for joint swelling.  Skin: Negative for rash.  Neurological: Negative for headaches.  Hematological: Does not bruise/bleed easily.  Psychiatric/Behavioral: Negative for dysphoric mood. The patient is not nervous/anxious.        Objective:   Physical Exam Overweight male in no acute distress Nose without purulence or discharge noted Neck without lymphadenopathy or thyromegaly Lower extremities without edema, no cyanosis Alert and oriented, moves all 4 extremities.       Assessment & Plan:

## 2015-04-29 NOTE — Assessment & Plan Note (Signed)
The patient has minimal obstructive sleep apnea by his recent home sleep test, and I have reviewed with him and answered questions. I stressed to him this is not a health issue, and would treat this aggressively only if it was impacting his quality of life for his bed partner sleep. Treatment options can include a dental appliance or possible upper airway surgery other than weight loss. At this point, the patient would like to work on weight loss, and will call if he wishes to treat this more aggressively.

## 2015-04-29 NOTE — Patient Instructions (Signed)
Work on weight loss. If you wish to treat your snoring and mild sleep apnea more aggressively, can consider a dental appliance or referral to ENT to consider a surgical approach. followup with Dr. Craige CottaSood as needed.

## 2015-04-30 NOTE — Progress Notes (Signed)
I left a detailed message to give the office a call to schedule his physical appointment

## 2015-10-28 HISTORY — PX: OTHER SURGICAL HISTORY: SHX169

## 2016-01-06 IMAGING — MR MR ANKLE*R* W/O CM
4 of 5 series · 18 of 40 positions shown · non-contrast
Comparison: Radiographs 01/28/2014.

CLINICAL DATA: Bilateral heel/Achilles pain and swelling for 1
year. No acute injury or prior relevant surgery. Initial encounter.

EXAM:
MRI OF THE RIGHT ANKLE WITHOUT CONTRAST
TECHNIQUE: Multiplanar, multisequence MR imaging of the ankle was performed. No
intravenous contrast was administered.

[Series 3: T1 · axial · 4.0mm · 0.31mm/px · z∈[-84,+29]mm · 4 of 32 slices shown (1 of 2)]
[im 1/32]
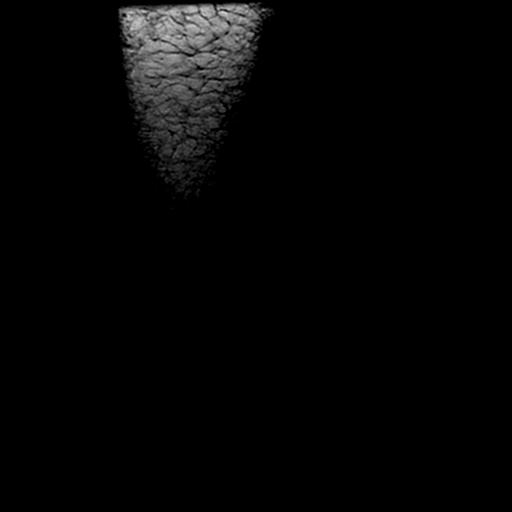
[im 6/32]
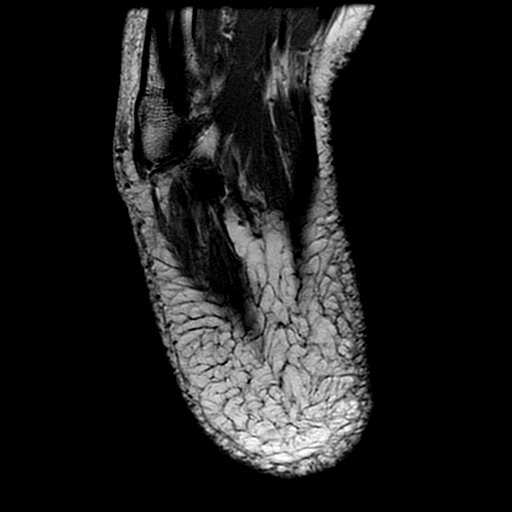
[im 16/32]
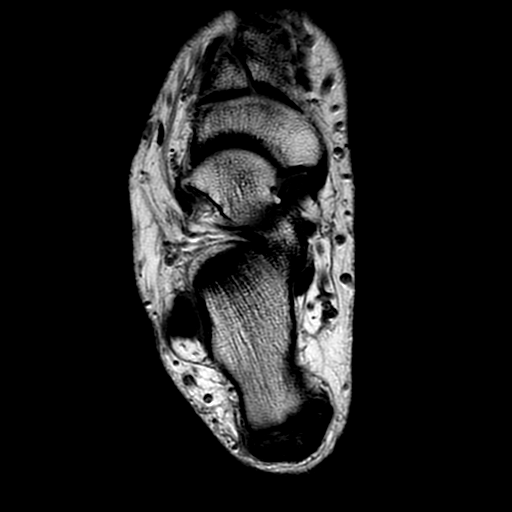
[im 26/32]
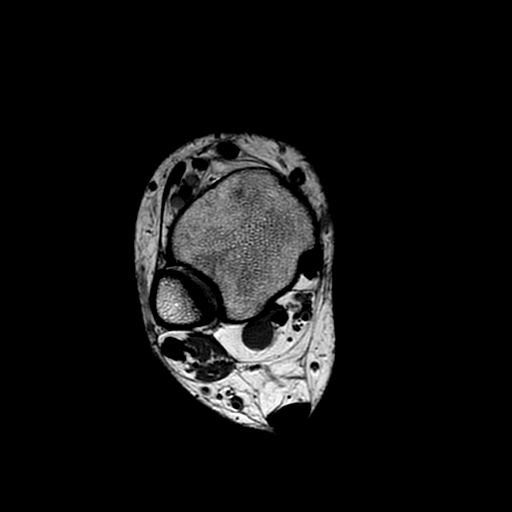

[Series 4: T2 · axial · 4.0mm · 0.31mm/px · z∈[-61,+29]mm · 3 of 32 slices shown]
[im 6/32]
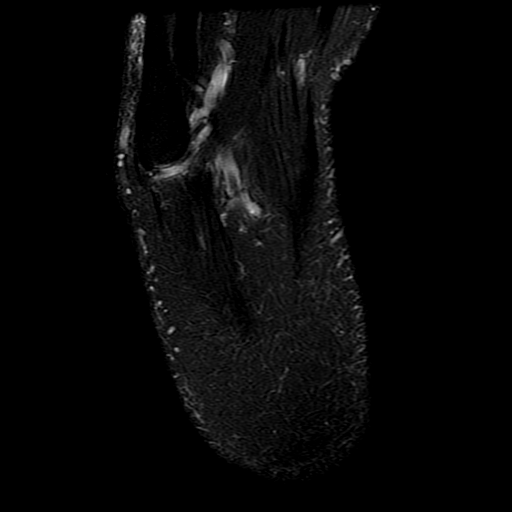
[im 16/32]
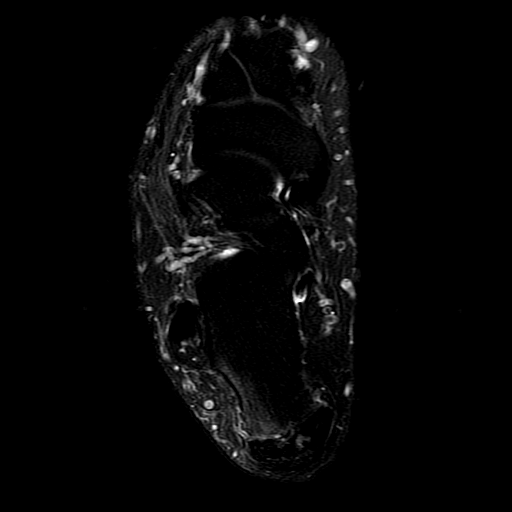
[im 26/32]
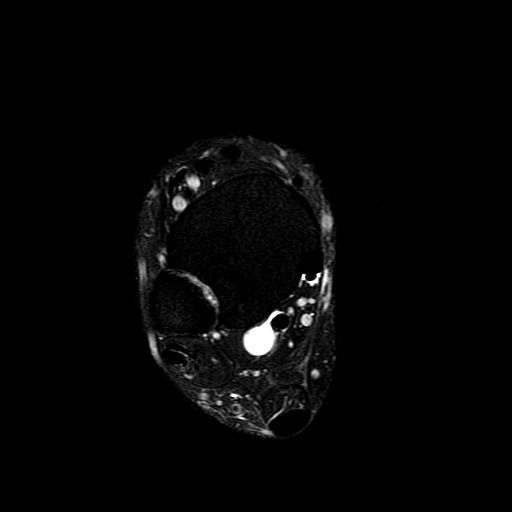

[Series 5: T2 fat-sat · coronal · 3.0mm · 0.31mm/px · 8 of 42 slices shown]
[im 1/42]
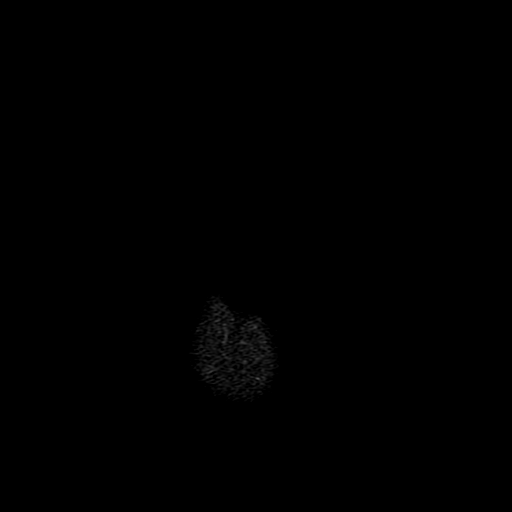
[im 5/42]
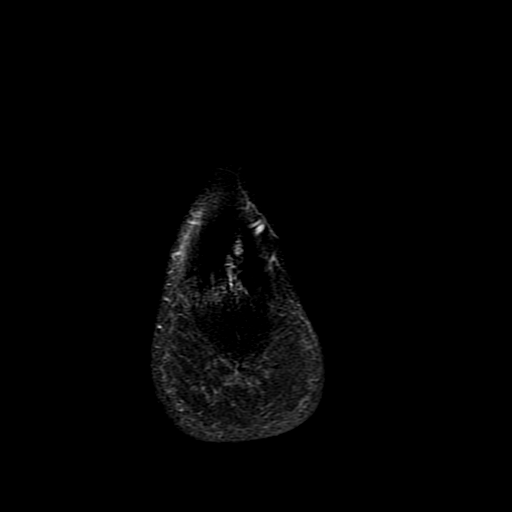
[im 14/42]
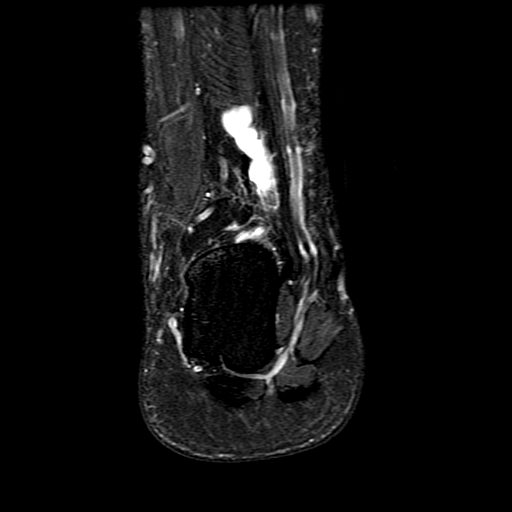
[im 19/42]
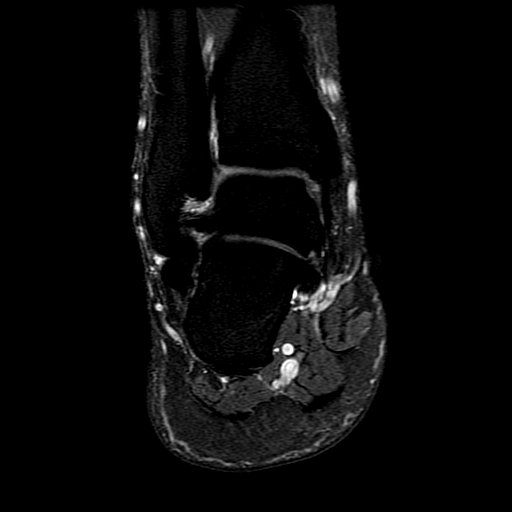
[im 23/42]
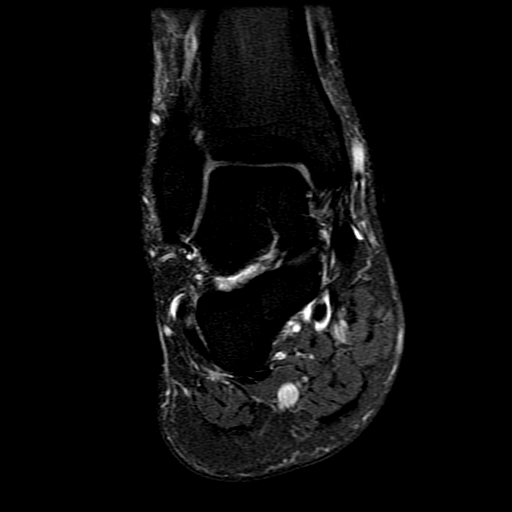
[im 28/42]
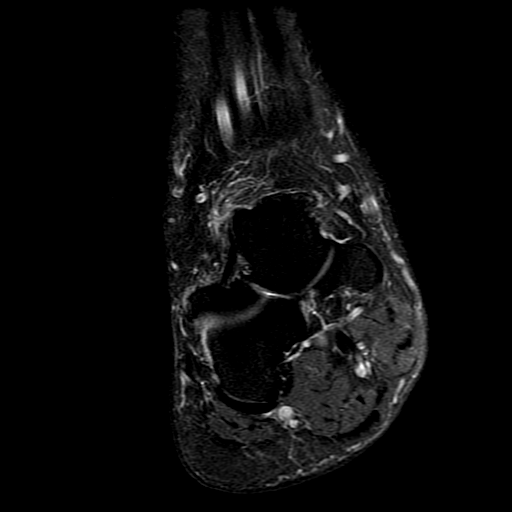
[im 37/42]
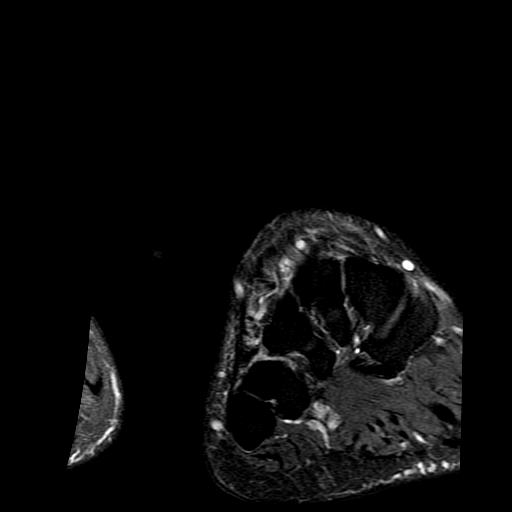
[im 42/42]
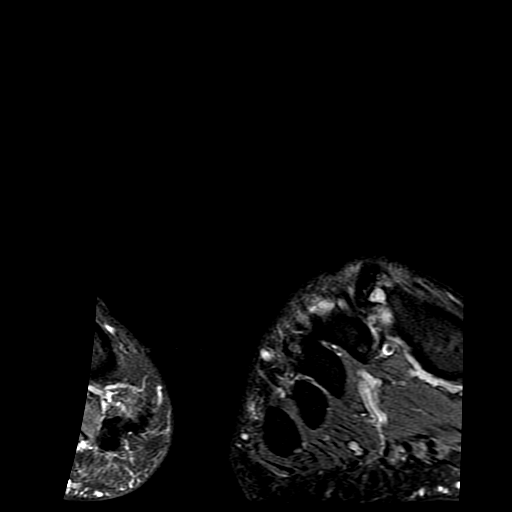

[Series 6: T1 · sagittal · 3.0mm · 0.31mm/px · 3 of 32 slices shown (2 of 2)]
[im 5/32]
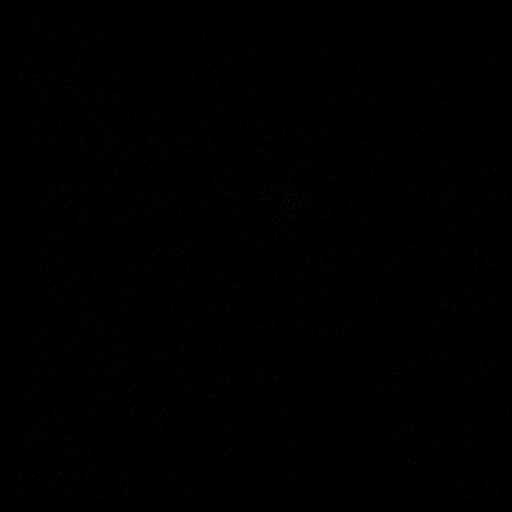
[im 18/32]
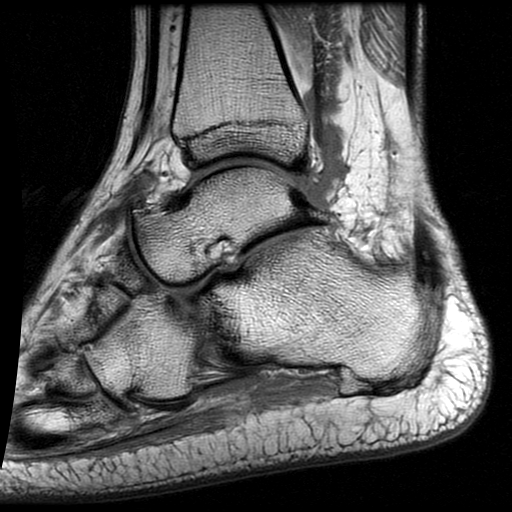
[im 27/32]
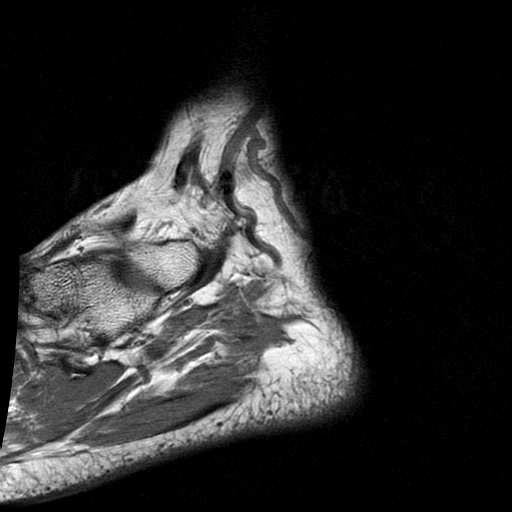

[18 of 40 positions shown; findings below may reference images not displayed]

FINDINGS: TENDONS

Peroneal: Intact and normally positioned.

Posteromedial: Intact and normally position. Mildly prominent fluid
within the flexor hallucis longus tendon sheath is likely due to
communication with the ankle joint.

Anterior: Intact and normally positioned.

Achilles: Moderate Achilles insertional tendinosis with fragmented
osteophytes, a Haglund deformity and reactive marrow edema
superiorly in the calcaneal tuberosity. There is a moderate amount
of fluid in the retrocalcaneal bursa.

Plantar Fascia: Intact with normal signal. There is plantar
calcaneal spurring, primarily adjacent to the lateral cord
attachment.

LIGAMENTS

Lateral: The anterior talofibular ligament is not well visualized,
possibly related to old injury. The posterior talofibular and
calcaneal fibular ligaments appear normal.

Medial: Intact.

CARTILAGE

Ankle Joint: No significant ankle joint effusion. The talar dome and
tibial plafond are intact.

Subtalar Joints/Sinus Tarsi: The subtalar joint and tarsal sinus
demonstrate no significant findings. Moderate midfoot degenerative
changes are present with dorsal osteophytes. There is a probable 8
mm ganglion dorsally between the fourth and fifth metatarsal bases.

Bones: No significant extra-articular osseous findings.
IMPRESSION: 1. Moderate insertional Achilles tendinosis with reactive marrow
edema, Haglund deformity and retrocalcaneal bursitis.
2. Possible old anterior talofibular ligament injury.
3. Midfoot degenerative changes with small ganglion between the
fourth and fifth metatarsal bases.

## 2016-03-27 ENCOUNTER — Ambulatory Visit: Payer: Self-pay | Admitting: General Surgery

## 2016-03-27 NOTE — H&P (Signed)
History of Present Illness Jay Duran([name] MD; 03/27/2016 11:46 AM) The patient is a 40 year old male who presents with anal fistula. 40 year old male who comes in today for follow-up after seton placement. Patient has been doing well and healing well since his last visit. He does have approximately a 1 x 2 cm area of granulation tissue still forming in the peritoneum anterior to his anus. Burnadette PopSeton is still in place.   Allergies Fay Records(Ashley Beck, CMA; 03/27/2016 11:41 AM) No Known Drug Allergies01/03/2016  Medication History Fay Records(Ashley Beck, CMA; 03/27/2016 11:41 AM) Temovate (0.05% Ointment, External) Active. Medications Reconciled  Vitals Fay Records(Ashley Beck CMA; 03/27/2016 11:41 AM) 03/27/2016 11:41 AM Weight: 279 lb Height: 74in Body Surface Area: 2.5 m Body Mass Index: 35.82 kg/m  Temp.: 97.72F  Pulse: 98 (Regular)  BP: 150/82 (Sitting, Left Arm, Standard)       Physical Exam Jay Duran(Camrynn Mcclintic MD; 03/27/2016 11:47 AM) Rectal Note: Seton in place, approximately 1 x 2 cm area of granulation tissue anterior to the anus.     Assessment & Plan Jay Duran(Arvell Pulsifer MD; 03/27/2016 11:48 AM) ANAL FISTULA (K60.3) Impression: 40 year old male with an anal fistula seton in place.  1. Will proceed to the operating for anal fistula plug under anesthesia. 2. I discussed with him the risks and benefits of the procedure to include but not limited to: Infection, bleeding, damage to structures, possible recurrence, possible need further surgery. Patient was understanding and wished to proceed. 3. Rectal prep.

## 2016-05-15 NOTE — Pre-Procedure Instructions (Signed)
    Jay BanisterJames H Coye  05/15/2016    Your procedure is scheduled on Monday, June 26.  [facility] at 8:45 AM               Your surgery or procedure is scheduled for 10:45 AM   Call this number if you have problems the morning of surgery:[phone]                For any other questions, please call [phone], Monday - Friday 8 AM - 4 PM.   Remember:  Do not eat food or drink liquids after midnight Sunday, June 25.  Take these medicines the morning of surgery with A SIP OF WATER: None                DO NOT take any Aspirin . Aspirin Products, Vitamins, Ibuprofen (Advil), Naproxen (Aleve) or Herbal Products.   Do not wear jewelry, make-up or nail polish.  Do not wear lotions, powders, or perfumes.    Men may shave face and neck.  Do not bring valuables to the hospital.  [facility] not [facility] any belongings or valuables.  Contacts, dentures or bridgework may not be worn into surgery.  Leave your suitcase in the car.  After surgery it may be brought to your room.  For patients admitted to the hospital, discharge time will be determined by your treatment team.  Patients discharged the day of surgery will not be allowed to drive home.   Name and phone number of your driver:   -  Please read over the following fact sheets that you were given: [facility]- Preparing For [facility]

## 2016-05-16 ENCOUNTER — Encounter (HOSPITAL_COMMUNITY)
Admission: RE | Admit: 2016-05-16 | Discharge: 2016-05-16 | Disposition: A | Discharge status: Home / Self Care | Payer: 59 | Source: Ambulatory Visit | Attending: General Surgery | Admitting: General Surgery

## 2016-05-16 ENCOUNTER — Encounter (HOSPITAL_COMMUNITY): Payer: Self-pay

## 2016-05-16 DIAGNOSIS — K603 Anal fistula: Secondary | ICD-10-CM | POA: Diagnosis not present

## 2016-05-16 DIAGNOSIS — Z01812 Encounter for preprocedural laboratory examination: Secondary | ICD-10-CM | POA: Insufficient documentation

## 2016-05-16 LAB — CBC
HCT: 38.9 % — ABNORMAL LOW (ref 39.0–52.0)
Hemoglobin: 12.1 g/dL — ABNORMAL LOW (ref 13.0–17.0)
MCH: 25.7 pg — AB (ref 26.0–34.0)
MCHC: 31.1 g/dL (ref 30.0–36.0)
MCV: 82.8 fL (ref 78.0–100.0)
PLATELETS: 253 10*3/uL (ref 150–400)
RBC: 4.7 MIL/uL (ref 4.22–5.81)
RDW: 14.8 % (ref 11.5–15.5)
WBC: 5.9 10*3/uL (ref 4.0–10.5)

## 2016-05-16 NOTE — Progress Notes (Signed)
Jay Duran denies chest pain or shortness of breath.  Patient does not have a PCP.

## 2016-05-22 ENCOUNTER — Encounter (HOSPITAL_COMMUNITY): Payer: Self-pay | Admitting: *Deleted

## 2016-05-22 ENCOUNTER — Encounter (HOSPITAL_COMMUNITY)
Admission: RE | Disposition: A | Discharge status: Home / Self Care | Payer: Self-pay | Source: Ambulatory Visit | Attending: General Surgery

## 2016-05-22 ENCOUNTER — Ambulatory Visit (HOSPITAL_COMMUNITY)
Admission: RE | Admit: 2016-05-22 | Discharge: 2016-05-22 | Disposition: A | Discharge status: Home / Self Care | Payer: 59 | Source: Ambulatory Visit | Attending: General Surgery | Admitting: General Surgery

## 2016-05-22 ENCOUNTER — Ambulatory Visit (HOSPITAL_COMMUNITY): Payer: 59 | Admitting: Certified Registered"

## 2016-05-22 DIAGNOSIS — K603 Anal fistula: Secondary | ICD-10-CM | POA: Diagnosis present

## 2016-05-22 DIAGNOSIS — G4730 Sleep apnea, unspecified: Secondary | ICD-10-CM | POA: Diagnosis not present

## 2016-05-22 SURGERY — EXAM UNDER ANESTHESIA
Anesthesia: General | Site: Anus

## 2016-05-22 MED ORDER — PROPOFOL 10 MG/ML IV BOLUS
INTRAVENOUS | Status: DC | PRN
  Administered 2016-05-22: 300 mg via INTRAVENOUS

## 2016-05-22 MED ORDER — MIDAZOLAM HCL 2 MG/2ML IJ SOLN
INTRAMUSCULAR | Status: AC
  Filled 2016-05-22: qty 2

## 2016-05-22 MED ORDER — OXYCODONE-ACETAMINOPHEN 5-325 MG PO TABS: 1.0000 | ORAL_TABLET | ORAL | Status: AC | PRN

## 2016-05-22 MED ORDER — CHLORHEXIDINE GLUCONATE 4 % EX LIQD: 1.0000 "application " | Freq: Once | CUTANEOUS | Status: DC

## 2016-05-22 MED ORDER — PROPOFOL 10 MG/ML IV BOLUS
INTRAVENOUS | Status: AC
  Filled 2016-05-22: qty 20

## 2016-05-22 MED ORDER — ONDANSETRON HCL 4 MG/2ML IJ SOLN
INTRAMUSCULAR | Status: DC | PRN
  Administered 2016-05-22: 4 mg via INTRAVENOUS

## 2016-05-22 MED ORDER — ROCURONIUM BROMIDE 50 MG/5ML IV SOLN
INTRAVENOUS | Status: AC
  Filled 2016-05-22: qty 2

## 2016-05-22 MED ORDER — LACTATED RINGERS IV SOLN
INTRAVENOUS | Status: DC | PRN
  Administered 2016-05-22 (×2): via INTRAVENOUS

## 2016-05-22 MED ORDER — LIDOCAINE 2% (20 MG/ML) 5 ML SYRINGE
INTRAMUSCULAR | Status: AC
  Filled 2016-05-22: qty 5

## 2016-05-22 MED ORDER — ARTIFICIAL TEARS OP OINT
TOPICAL_OINTMENT | OPHTHALMIC | Status: AC
  Filled 2016-05-22: qty 3.5

## 2016-05-22 MED ORDER — 0.9 % SODIUM CHLORIDE (POUR BTL) OPTIME
TOPICAL | Status: DC | PRN
  Administered 2016-05-22: 1000 mL

## 2016-05-22 MED ORDER — BUPIVACAINE-EPINEPHRINE (PF) 0.5% -1:200000 IJ SOLN
INTRAMUSCULAR | Status: DC | PRN
  Administered 2016-05-22: 30 mL via PERINEURAL

## 2016-05-22 MED ORDER — DIBUCAINE 1 % RE OINT
TOPICAL_OINTMENT | RECTAL | Status: AC
  Filled 2016-05-22: qty 28

## 2016-05-22 MED ORDER — MEPERIDINE HCL 25 MG/ML IJ SOLN: 6.2500 mg | INTRAMUSCULAR | Status: DC | PRN

## 2016-05-22 MED ORDER — LIDOCAINE HCL (CARDIAC) 20 MG/ML IV SOLN
INTRAVENOUS | Status: DC | PRN
  Administered 2016-05-22: 100 mg via INTRAVENOUS

## 2016-05-22 MED ORDER — FENTANYL CITRATE (PF) 100 MCG/2ML IJ SOLN
INTRAMUSCULAR | Status: DC | PRN
  Administered 2016-05-22: 100 ug via INTRAVENOUS
  Administered 2016-05-22 (×3): 50 ug via INTRAVENOUS

## 2016-05-22 MED ORDER — ONDANSETRON HCL 4 MG/2ML IJ SOLN: 4.0000 mg | Freq: Once | INTRAMUSCULAR | Status: DC | PRN

## 2016-05-22 MED ORDER — MIDAZOLAM HCL 5 MG/5ML IJ SOLN
INTRAMUSCULAR | Status: DC | PRN
  Administered 2016-05-22: 2 mg via INTRAVENOUS

## 2016-05-22 MED ORDER — LACTATED RINGERS IV SOLN
Freq: Once | INTRAVENOUS | Status: AC
  Administered 2016-05-22: 09:00:00 via INTRAVENOUS

## 2016-05-22 MED ORDER — BUPIVACAINE-EPINEPHRINE (PF) 0.5% -1:200000 IJ SOLN
INTRAMUSCULAR | Status: AC
  Filled 2016-05-22: qty 30

## 2016-05-22 MED ORDER — FENTANYL CITRATE (PF) 250 MCG/5ML IJ SOLN
INTRAMUSCULAR | Status: AC
  Filled 2016-05-22: qty 5

## 2016-05-22 MED ORDER — HYDROMORPHONE HCL 1 MG/ML IJ SOLN: 0.2500 mg | INTRAMUSCULAR | Status: DC | PRN

## 2016-05-22 MED ORDER — DEXAMETHASONE SODIUM PHOSPHATE 10 MG/ML IJ SOLN
INTRAMUSCULAR | Status: DC | PRN
  Administered 2016-05-22: 10 mg via INTRAVENOUS

## 2016-05-22 SURGICAL SUPPLY — 47 items
CANISTER SUCTION 2500CC (MISCELLANEOUS) ×3 IMPLANT
COVER MAYO STAND STRL (DRAPES) ×3 IMPLANT
COVER SURGICAL LIGHT HANDLE (MISCELLANEOUS) ×3 IMPLANT
DECANTER SPIKE VIAL GLASS SM (MISCELLANEOUS) ×3 IMPLANT
DRAPE UTILITY XL STRL (DRAPES) ×12 IMPLANT
DRSG PAD ABDOMINAL 8X10 ST (GAUZE/BANDAGES/DRESSINGS) ×3 IMPLANT
ELECT CAUTERY BLADE 6.4 (BLADE) ×3 IMPLANT
ELECT REM PT RETURN 9FT ADLT (ELECTROSURGICAL) ×3
ELECTRODE REM PT RTRN 9FT ADLT (ELECTROSURGICAL) ×1 IMPLANT
GAUZE SPONGE 4X4 12PLY STRL (GAUZE/BANDAGES/DRESSINGS) ×3 IMPLANT
GAUZE SPONGE 4X4 16PLY XRAY LF (GAUZE/BANDAGES/DRESSINGS) ×3 IMPLANT
GLOVE BIO SURGEON STRL SZ 6.5 (GLOVE) ×2 IMPLANT
GLOVE BIO SURGEON STRL SZ7.5 (GLOVE) ×3 IMPLANT
GLOVE BIO SURGEONS STRL SZ 6.5 (GLOVE) ×1
GLOVE BIOGEL PI IND STRL 7.0 (GLOVE) ×3 IMPLANT
GLOVE BIOGEL PI IND STRL 8 (GLOVE) ×1 IMPLANT
GLOVE BIOGEL PI INDICATOR 7.0 (GLOVE) ×6
GLOVE BIOGEL PI INDICATOR 8 (GLOVE) ×2
GLOVE SURG SS PI 6.5 STRL IVOR (GLOVE) ×9 IMPLANT
GOWN STRL REUS W/ TWL LRG LVL3 (GOWN DISPOSABLE) ×2 IMPLANT
GOWN STRL REUS W/ TWL XL LVL3 (GOWN DISPOSABLE) ×1 IMPLANT
GOWN STRL REUS W/TWL LRG LVL3 (GOWN DISPOSABLE) ×4
GOWN STRL REUS W/TWL XL LVL3 (GOWN DISPOSABLE) ×2
KIT ANAL FISTULA (Mesh General) ×3 IMPLANT
KIT BASIN OR (CUSTOM PROCEDURE TRAY) ×3 IMPLANT
KIT ROOM TURNOVER OR (KITS) ×3 IMPLANT
LOOP VESSEL MAXI BLUE (MISCELLANEOUS) IMPLANT
NEEDLE HYPO 25GX1X1/2 BEV (NEEDLE) ×3 IMPLANT
NS IRRIG 1000ML POUR BTL (IV SOLUTION) ×3 IMPLANT
PACK LITHOTOMY IV (CUSTOM PROCEDURE TRAY) ×3 IMPLANT
PAD ARMBOARD 7.5X6 YLW CONV (MISCELLANEOUS) ×6 IMPLANT
PENCIL BUTTON HOLSTER BLD 10FT (ELECTRODE) ×3 IMPLANT
SHEARS HARMONIC 9CM CVD (BLADE) IMPLANT
SPECIMEN JAR SMALL (MISCELLANEOUS) IMPLANT
SPONGE HEMORRHOID 8X3CM (HEMOSTASIS) IMPLANT
SURGILUBE 2OZ TUBE FLIPTOP (MISCELLANEOUS) ×3 IMPLANT
SUT CHROMIC 2 0 SH (SUTURE) ×3 IMPLANT
SUT CHROMIC 3 0 SH 27 (SUTURE) IMPLANT
SUT SILK 0 TIES 10X30 (SUTURE) ×3 IMPLANT
SYR BULB 3OZ (MISCELLANEOUS) IMPLANT
SYR CONTROL 10ML LL (SYRINGE) ×3 IMPLANT
TOWEL OR 17X24 6PK STRL BLUE (TOWEL DISPOSABLE) ×3 IMPLANT
TOWEL OR 17X26 10 PK STRL BLUE (TOWEL DISPOSABLE) ×3 IMPLANT
TUBE CONNECTING 12'X1/4 (SUCTIONS) ×1
TUBE CONNECTING 12X1/4 (SUCTIONS) ×2 IMPLANT
WATER STERILE IRR 1000ML POUR (IV SOLUTION) ×3 IMPLANT
YANKAUER SUCT BULB TIP NO VENT (SUCTIONS) ×3 IMPLANT

## 2016-05-22 NOTE — Op Note (Signed)
05/22/2016  1:56 PM  PATIENT:  Jay Duran  40 y.o. male  PRE-OPERATIVE DIAGNOSIS:  Anal fistula  POST-OPERATIVE DIAGNOSIS:  Anal fistula  PROCEDURE:  Procedure(s): EXAM UNDER ANESTHESIA & ANAL FISTULA PLUG PLACEMENT  (N/A)  SURGEON:  Surgeon(s) and Role:    * Axel FillerArmando Raunel Dimartino, MD - Primary  ANESTHESIA:   local and general  EBL:10cc Total I/O In: 500 [I.V.:500] Out: -   BLOOD ADMINISTERED:none  DRAINS: none   LOCAL MEDICATIONS USED:  BUPIVICAINE   SPECIMEN:  No Specimen  DISPOSITION OF SPECIMEN:  N/A  COUNTS:  YES  TOURNIQUET:  * No tourniquets in log *  DICTATION: .Dragon Dictation Indications for procedure: Patient is a 40 year old male with a previous perianal abscess secondary to an anal fistula. Patient had a seton placed approximately 3-4 months ago. Patient was seen in clinic to have this plugged with a anal fistula plug.  Details of procedure: After the patient was consented he was taken back to the operating room and placed in the supine position with bilateral testes in place. He underwent general endotracheal anesthesia. Patient was in placed in the lithotomy position. He was prepped and draped in the standard fashion. A timeout was called all facts verified.  A sigmoidoscope was placed into the rectum. The seton was easily visualized. There was some hypertrophied granulation tissue at the perineum. This was cauterized. The granulation tissue was debrided off. Electrocautery was used to maintain hemostasis.  At this time the seton was cut. A 0 silk stitch was attached to the seton. This is brought to the fistula. This appeared to be deep to the external anal sphincter. At this time the wire brush was then brought through the fistula in the fistula track was debrided. At this time the 0.6 x 9.5 mm Cook anal fistula plug was then dampened with saline. This was attached to the suture of the wire brush. This brought to the fistula. The fistula was occluded  using the mesh. This was secured to the internal and external openings using a 2-0 chromic in interrupted fashion. The tip of the fistula plug was cut.  There is a debridement in the fistula were then infiltrated with quarter percent Marcaine with epi. The area was dressed with 4 x 4's, AVD pad, mesh panties. The patient tied the procedure well was taken to the recovery room stable condition.   PLAN OF CARE: Discharge to home after PACU  PATIENT DISPOSITION:  PACU - hemodynamically stable.   Delay start of Pharmacological VTE agent (>24hrs) due to surgical blood loss or risk of bleeding: not applicable

## 2016-05-22 NOTE — Transfer of Care (Signed)
Immediate Anesthesia Transfer of Care Note  Patient: Jay BanisterJames H Sarr  Procedure(s) Performed: Procedure(s): EXAM UNDER ANESTHESIA FISTULA PLUG PLACEMENT  (N/A)  Patient Location: PACU  Anesthesia Type:General  Level of Consciousness: awake and patient cooperative  Airway & Oxygen Therapy: Patient Spontanous Breathing  Post-op Assessment: Report given to RN and Post -op Vital signs reviewed and stable  Post vital signs: Reviewed and stable  Last Vitals:  Filed Vitals:   05/22/16 0915  BP: 154/78  Pulse: 79  Temp: 37 C  Resp: 20    Last Pain: There were no vitals filed for this visit.    Patients Stated Pain Goal: 3 (05/22/16 0920)  Complications: No apparent anesthesia complications

## 2016-05-22 NOTE — Anesthesia Procedure Notes (Signed)
Procedure Name: LMA Insertion Date/Time: 05/22/2016 1:21 PM Performed by: Rosiland OzMEYERS, Deeana Atwater Pre-anesthesia Checklist: Patient identified, Emergency Drugs available, Suction available, Patient being monitored and Timeout performed Patient Re-evaluated:Patient Re-evaluated prior to inductionOxygen Delivery Method: Circle system utilized Preoxygenation: Pre-oxygenation with 100% oxygen Intubation Type: IV induction LMA: LMA inserted LMA Size: 5.0 Number of attempts: 1 Placement Confirmation: positive ETCO2 and breath sounds checked- equal and bilateral Tube secured with: Tape Dental Injury: Teeth and Oropharynx as per pre-operative assessment

## 2016-05-22 NOTE — H&P (Signed)
Jay Duran is an 40 y.o. male.    HPI: The patient is a 40 year old male who presents with anal fistula. 40 year old male who comes in today for follow-up after seton placement. Patient has been doing well and healing well since his last visit. He does have approximately a 1 x 2 cm area of granulation tissue still forming in the peritoneum anterior to his anus. Jay PopSeton is still in place.  Past Medical History  Diagnosis Date  . Eczema     sees [facility]  . Heel spur     both heels    Past Surgical History  Procedure Laterality Date  . Anal fistula plug  10/2015    Family History  Problem Relation Age of Onset  . Hypertension Mother   . Asthma Mother   . COPD Mother   . Hypertension Sister   . Hypertension Brother   . Hypertension Sister   . Cancer Sister     breast  . Kidney disease Father   . Diabetes Father    Social History:  reports that he has never smoked. He has never used smokeless tobacco. He reports that he does not drink alcohol or use illicit drugs.  Allergies: No Known Allergies  No prescriptions prior to admission    No results found for this or any previous visit (from the past 48 hour(s)). No results found.  ROS  Blood pressure 154/78, pulse 79, temperature 98.6 F (37 C), temperature source Oral, resp. rate 20, weight 126.554 kg (279 lb), SpO2 100 %. Physical Exam  Constitutional: He is oriented to person, place, and time. He appears well-developed and well-nourished.  HENT:  Head: Normocephalic and atraumatic.  Eyes: Conjunctivae and EOM are normal. Pupils are equal, round, and reactive to light.  Neck: Normal range of motion. Neck supple.  Cardiovascular: Normal rate, regular rhythm and normal heart sounds.   Respiratory: Effort normal and breath sounds normal.  GI: Soft.  Musculoskeletal: Normal range of motion.  Neurological: He is alert and oriented to person, place, and time.  Skin: Skin is warm and dry.    Rectal Note: Seton in place, approximately 1 x 2 cm area of granulation tissue anterior to the anus.  Assessment/Plan ANAL FISTULA (K60.3) Impression: 40 year old male with an anal fistula seton in place.  1. Will proceed to the operating for anal fistula plug under anesthesia. 2. I discussed with him the risks and benefits of the procedure to include but not limited to: Infection, bleeding, damage to structures, possible recurrence, possible need further surgery. Patient was understanding and wished to proceed.  Jay Saveramirez Jr., Jay White, MD 05/22/2016, 10:02 AM

## 2016-05-22 NOTE — Anesthesia Preprocedure Evaluation (Signed)
Anesthesia Evaluation  Patient identified by MRN, date of birth, ID band Patient awake    Reviewed: Allergy & Precautions, NPO status , Patient's Chart, lab work & pertinent test results  Airway Mallampati: I  TM Distance: >3 FB Neck ROM: Full    Dental   Pulmonary sleep apnea ,    Pulmonary exam normal        Cardiovascular Normal cardiovascular exam     Neuro/Psych    GI/Hepatic   Endo/Other    Renal/GU      Musculoskeletal   Abdominal   Peds  Hematology   Anesthesia Other Findings   Reproductive/Obstetrics                             Anesthesia Physical Anesthesia Plan  ASA: III  Anesthesia Plan: General   Post-op Pain Management:    Induction: Intravenous  Airway Management Planned: LMA  Additional Equipment:   Intra-op Plan:   Post-operative Plan: Extubation in OR  Informed Consent: I have reviewed the patients History and Physical, chart, labs and discussed the procedure including the risks, benefits and alternatives for the proposed anesthesia with the patient or authorized representative who has indicated his/her understanding and acceptance.     Plan Discussed with: CRNA and Surgeon  Anesthesia Plan Comments:         Anesthesia Quick Evaluation

## 2016-05-23 NOTE — Anesthesia Postprocedure Evaluation (Signed)
Anesthesia Post Note  Patient: Jay Duran  Procedure(s) Performed: Procedure(s) (LRB): EXAM UNDER ANESTHESIA FISTULA PLUG PLACEMENT  (N/A)  Patient location during evaluation: PACU Anesthesia Type: General Level of consciousness: awake and alert Pain management: pain level controlled Vital Signs Assessment: post-procedure vital signs reviewed and stable Respiratory status: spontaneous breathing, nonlabored ventilation and respiratory function stable Cardiovascular status: blood pressure returned to baseline and stable Postop Assessment: no signs of nausea or vomiting Anesthetic complications: no    Last Vitals:  Filed Vitals:   05/22/16 1619 05/22/16 1645  BP: 139/85   Pulse: 79   Temp:  36.4 C  Resp: 11     Last Pain: There were no vitals filed for this visit.               Titianna Loomis A

## 2020-01-28 DIAGNOSIS — Z79899 Other long term (current) drug therapy: Secondary | ICD-10-CM | POA: Diagnosis not present

## 2020-01-28 DIAGNOSIS — L732 Hidradenitis suppurativa: Secondary | ICD-10-CM | POA: Diagnosis not present

## 2020-06-02 DIAGNOSIS — E669 Obesity, unspecified: Secondary | ICD-10-CM | POA: Diagnosis not present

## 2020-06-02 DIAGNOSIS — Z125 Encounter for screening for malignant neoplasm of prostate: Secondary | ICD-10-CM | POA: Diagnosis not present

## 2020-06-02 DIAGNOSIS — R82998 Other abnormal findings in urine: Secondary | ICD-10-CM | POA: Diagnosis not present

## 2020-06-02 DIAGNOSIS — Z0000 Encounter for general adult medical examination without abnormal findings: Secondary | ICD-10-CM | POA: Diagnosis not present

## 2020-06-03 DIAGNOSIS — Z1212 Encounter for screening for malignant neoplasm of rectum: Secondary | ICD-10-CM | POA: Diagnosis not present

## 2020-06-08 DIAGNOSIS — M5136 Other intervertebral disc degeneration, lumbar region: Secondary | ICD-10-CM | POA: Diagnosis not present

## 2020-06-08 DIAGNOSIS — E669 Obesity, unspecified: Secondary | ICD-10-CM | POA: Diagnosis not present

## 2020-06-08 DIAGNOSIS — L732 Hidradenitis suppurativa: Secondary | ICD-10-CM | POA: Diagnosis not present

## 2020-06-08 DIAGNOSIS — Z0000 Encounter for general adult medical examination without abnormal findings: Secondary | ICD-10-CM | POA: Diagnosis not present

## 2020-06-08 DIAGNOSIS — Z1331 Encounter for screening for depression: Secondary | ICD-10-CM | POA: Diagnosis not present

## 2020-06-08 DIAGNOSIS — G4733 Obstructive sleep apnea (adult) (pediatric): Secondary | ICD-10-CM | POA: Diagnosis not present

## 2021-01-27 DIAGNOSIS — L910 Hypertrophic scar: Secondary | ICD-10-CM | POA: Diagnosis not present

## 2021-01-27 DIAGNOSIS — L738 Other specified follicular disorders: Secondary | ICD-10-CM | POA: Diagnosis not present

## 2021-01-27 DIAGNOSIS — L400 Psoriasis vulgaris: Secondary | ICD-10-CM | POA: Diagnosis not present

## 2021-01-27 DIAGNOSIS — Z79899 Other long term (current) drug therapy: Secondary | ICD-10-CM | POA: Diagnosis not present

## 2021-01-27 DIAGNOSIS — L732 Hidradenitis suppurativa: Secondary | ICD-10-CM | POA: Diagnosis not present

## 2021-02-01 DIAGNOSIS — Z79899 Other long term (current) drug therapy: Secondary | ICD-10-CM | POA: Diagnosis not present

## 2021-02-01 DIAGNOSIS — L732 Hidradenitis suppurativa: Secondary | ICD-10-CM | POA: Diagnosis not present

## 2021-02-24 DIAGNOSIS — L910 Hypertrophic scar: Secondary | ICD-10-CM | POA: Diagnosis not present

## 2021-03-30 DIAGNOSIS — L910 Hypertrophic scar: Secondary | ICD-10-CM | POA: Diagnosis not present

## 2021-04-27 DIAGNOSIS — L738 Other specified follicular disorders: Secondary | ICD-10-CM | POA: Diagnosis not present

## 2021-04-27 DIAGNOSIS — L910 Hypertrophic scar: Secondary | ICD-10-CM | POA: Diagnosis not present

## 2021-04-27 DIAGNOSIS — L089 Local infection of the skin and subcutaneous tissue, unspecified: Secondary | ICD-10-CM | POA: Diagnosis not present

## 2021-05-24 DIAGNOSIS — L910 Hypertrophic scar: Secondary | ICD-10-CM | POA: Diagnosis not present

## 2021-06-17 DIAGNOSIS — Z0000 Encounter for general adult medical examination without abnormal findings: Secondary | ICD-10-CM | POA: Diagnosis not present

## 2021-06-17 DIAGNOSIS — E669 Obesity, unspecified: Secondary | ICD-10-CM | POA: Diagnosis not present

## 2021-06-17 DIAGNOSIS — Z125 Encounter for screening for malignant neoplasm of prostate: Secondary | ICD-10-CM | POA: Diagnosis not present

## 2021-06-22 DIAGNOSIS — L910 Hypertrophic scar: Secondary | ICD-10-CM | POA: Diagnosis not present

## 2021-06-22 DIAGNOSIS — L738 Other specified follicular disorders: Secondary | ICD-10-CM | POA: Diagnosis not present

## 2021-06-24 DIAGNOSIS — L732 Hidradenitis suppurativa: Secondary | ICD-10-CM | POA: Diagnosis not present

## 2021-06-24 DIAGNOSIS — Z1339 Encounter for screening examination for other mental health and behavioral disorders: Secondary | ICD-10-CM | POA: Diagnosis not present

## 2021-06-24 DIAGNOSIS — Z0000 Encounter for general adult medical examination without abnormal findings: Secondary | ICD-10-CM | POA: Diagnosis not present

## 2021-06-24 DIAGNOSIS — R82998 Other abnormal findings in urine: Secondary | ICD-10-CM | POA: Diagnosis not present

## 2021-06-24 DIAGNOSIS — Z1331 Encounter for screening for depression: Secondary | ICD-10-CM | POA: Diagnosis not present

## 2021-09-19 DIAGNOSIS — L910 Hypertrophic scar: Secondary | ICD-10-CM | POA: Diagnosis not present

## 2021-09-19 DIAGNOSIS — L738 Other specified follicular disorders: Secondary | ICD-10-CM | POA: Diagnosis not present

## 2022-01-30 DIAGNOSIS — Z79899 Other long term (current) drug therapy: Secondary | ICD-10-CM | POA: Diagnosis not present

## 2022-01-30 DIAGNOSIS — L732 Hidradenitis suppurativa: Secondary | ICD-10-CM | POA: Diagnosis not present

## 2022-03-29 DIAGNOSIS — J020 Streptococcal pharyngitis: Secondary | ICD-10-CM | POA: Diagnosis not present

## 2022-03-29 DIAGNOSIS — R110 Nausea: Secondary | ICD-10-CM | POA: Diagnosis not present

## 2022-06-28 DIAGNOSIS — R7989 Other specified abnormal findings of blood chemistry: Secondary | ICD-10-CM | POA: Diagnosis not present

## 2022-06-28 DIAGNOSIS — Z125 Encounter for screening for malignant neoplasm of prostate: Secondary | ICD-10-CM | POA: Diagnosis not present

## 2022-06-28 DIAGNOSIS — Z0000 Encounter for general adult medical examination without abnormal findings: Secondary | ICD-10-CM | POA: Diagnosis not present

## 2022-06-29 DIAGNOSIS — Z0000 Encounter for general adult medical examination without abnormal findings: Secondary | ICD-10-CM | POA: Diagnosis not present

## 2022-07-03 DIAGNOSIS — Z0000 Encounter for general adult medical examination without abnormal findings: Secondary | ICD-10-CM | POA: Diagnosis not present

## 2022-07-03 DIAGNOSIS — Z1339 Encounter for screening examination for other mental health and behavioral disorders: Secondary | ICD-10-CM | POA: Diagnosis not present

## 2022-07-03 DIAGNOSIS — L732 Hidradenitis suppurativa: Secondary | ICD-10-CM | POA: Diagnosis not present

## 2022-07-03 DIAGNOSIS — Z1331 Encounter for screening for depression: Secondary | ICD-10-CM | POA: Diagnosis not present

## 2022-07-03 DIAGNOSIS — R82998 Other abnormal findings in urine: Secondary | ICD-10-CM | POA: Diagnosis not present

## 2023-02-01 DIAGNOSIS — Z79899 Other long term (current) drug therapy: Secondary | ICD-10-CM | POA: Diagnosis not present

## 2023-02-01 DIAGNOSIS — L732 Hidradenitis suppurativa: Secondary | ICD-10-CM | POA: Diagnosis not present

## 2023-07-19 DIAGNOSIS — Z125 Encounter for screening for malignant neoplasm of prostate: Secondary | ICD-10-CM | POA: Diagnosis not present

## 2023-07-19 DIAGNOSIS — Z1212 Encounter for screening for malignant neoplasm of rectum: Secondary | ICD-10-CM | POA: Diagnosis not present

## 2023-07-19 DIAGNOSIS — R7989 Other specified abnormal findings of blood chemistry: Secondary | ICD-10-CM | POA: Diagnosis not present

## 2023-07-26 DIAGNOSIS — Z0000 Encounter for general adult medical examination without abnormal findings: Secondary | ICD-10-CM | POA: Diagnosis not present

## 2023-07-26 DIAGNOSIS — R82998 Other abnormal findings in urine: Secondary | ICD-10-CM | POA: Diagnosis not present

## 2023-07-26 DIAGNOSIS — L732 Hidradenitis suppurativa: Secondary | ICD-10-CM | POA: Diagnosis not present

## 2023-07-26 DIAGNOSIS — Z1339 Encounter for screening examination for other mental health and behavioral disorders: Secondary | ICD-10-CM | POA: Diagnosis not present

## 2023-07-26 DIAGNOSIS — Z1331 Encounter for screening for depression: Secondary | ICD-10-CM | POA: Diagnosis not present

## 2024-01-31 DIAGNOSIS — L732 Hidradenitis suppurativa: Secondary | ICD-10-CM | POA: Diagnosis not present

## 2024-02-04 DIAGNOSIS — L400 Psoriasis vulgaris: Secondary | ICD-10-CM | POA: Diagnosis not present

## 2024-02-04 DIAGNOSIS — Z79899 Other long term (current) drug therapy: Secondary | ICD-10-CM | POA: Diagnosis not present

## 2024-08-04 DIAGNOSIS — Z1212 Encounter for screening for malignant neoplasm of rectum: Secondary | ICD-10-CM | POA: Diagnosis not present

## 2024-08-04 DIAGNOSIS — I10 Essential (primary) hypertension: Secondary | ICD-10-CM | POA: Diagnosis not present

## 2024-08-04 DIAGNOSIS — Z125 Encounter for screening for malignant neoplasm of prostate: Secondary | ICD-10-CM | POA: Diagnosis not present

## 2024-08-11 DIAGNOSIS — R82998 Other abnormal findings in urine: Secondary | ICD-10-CM | POA: Diagnosis not present

## 2024-08-11 DIAGNOSIS — I10 Essential (primary) hypertension: Secondary | ICD-10-CM | POA: Diagnosis not present

## 2024-08-11 DIAGNOSIS — Z1331 Encounter for screening for depression: Secondary | ICD-10-CM | POA: Diagnosis not present

## 2024-08-11 DIAGNOSIS — Z0000 Encounter for general adult medical examination without abnormal findings: Secondary | ICD-10-CM | POA: Diagnosis not present

## 2024-08-11 DIAGNOSIS — Z1339 Encounter for screening examination for other mental health and behavioral disorders: Secondary | ICD-10-CM | POA: Diagnosis not present

## 2024-08-21 DIAGNOSIS — J039 Acute tonsillitis, unspecified: Secondary | ICD-10-CM | POA: Diagnosis not present

## 2024-08-21 DIAGNOSIS — R030 Elevated blood-pressure reading, without diagnosis of hypertension: Secondary | ICD-10-CM | POA: Diagnosis not present

## 2024-08-25 DIAGNOSIS — I10 Essential (primary) hypertension: Secondary | ICD-10-CM | POA: Diagnosis not present

## 2024-09-04 DIAGNOSIS — M25552 Pain in left hip: Secondary | ICD-10-CM | POA: Diagnosis not present

## 2024-09-04 DIAGNOSIS — M5137 Other intervertebral disc degeneration, lumbosacral region with discogenic back pain only: Secondary | ICD-10-CM | POA: Diagnosis not present

## 2024-09-04 DIAGNOSIS — M9905 Segmental and somatic dysfunction of pelvic region: Secondary | ICD-10-CM | POA: Diagnosis not present

## 2024-09-09 DIAGNOSIS — M25552 Pain in left hip: Secondary | ICD-10-CM | POA: Diagnosis not present

## 2024-09-09 DIAGNOSIS — M9903 Segmental and somatic dysfunction of lumbar region: Secondary | ICD-10-CM | POA: Diagnosis not present

## 2024-09-09 DIAGNOSIS — M5137 Other intervertebral disc degeneration, lumbosacral region with discogenic back pain only: Secondary | ICD-10-CM | POA: Diagnosis not present

## 2024-09-09 DIAGNOSIS — M9905 Segmental and somatic dysfunction of pelvic region: Secondary | ICD-10-CM | POA: Diagnosis not present

## 2024-09-11 DIAGNOSIS — M9903 Segmental and somatic dysfunction of lumbar region: Secondary | ICD-10-CM | POA: Diagnosis not present

## 2024-09-11 DIAGNOSIS — M25552 Pain in left hip: Secondary | ICD-10-CM | POA: Diagnosis not present

## 2024-09-11 DIAGNOSIS — M5137 Other intervertebral disc degeneration, lumbosacral region with discogenic back pain only: Secondary | ICD-10-CM | POA: Diagnosis not present

## 2024-09-11 DIAGNOSIS — M9905 Segmental and somatic dysfunction of pelvic region: Secondary | ICD-10-CM | POA: Diagnosis not present

## 2024-09-15 DIAGNOSIS — M9905 Segmental and somatic dysfunction of pelvic region: Secondary | ICD-10-CM | POA: Diagnosis not present

## 2024-09-15 DIAGNOSIS — M5137 Other intervertebral disc degeneration, lumbosacral region with discogenic back pain only: Secondary | ICD-10-CM | POA: Diagnosis not present

## 2024-09-15 DIAGNOSIS — M25552 Pain in left hip: Secondary | ICD-10-CM | POA: Diagnosis not present

## 2024-09-15 DIAGNOSIS — M9903 Segmental and somatic dysfunction of lumbar region: Secondary | ICD-10-CM | POA: Diagnosis not present

## 2024-09-16 DIAGNOSIS — M9903 Segmental and somatic dysfunction of lumbar region: Secondary | ICD-10-CM | POA: Diagnosis not present

## 2024-09-16 DIAGNOSIS — M5137 Other intervertebral disc degeneration, lumbosacral region with discogenic back pain only: Secondary | ICD-10-CM | POA: Diagnosis not present

## 2024-09-16 DIAGNOSIS — M25552 Pain in left hip: Secondary | ICD-10-CM | POA: Diagnosis not present

## 2024-09-16 DIAGNOSIS — M9905 Segmental and somatic dysfunction of pelvic region: Secondary | ICD-10-CM | POA: Diagnosis not present

## 2024-09-18 DIAGNOSIS — M9905 Segmental and somatic dysfunction of pelvic region: Secondary | ICD-10-CM | POA: Diagnosis not present

## 2024-09-18 DIAGNOSIS — M25552 Pain in left hip: Secondary | ICD-10-CM | POA: Diagnosis not present

## 2024-09-18 DIAGNOSIS — M5137 Other intervertebral disc degeneration, lumbosacral region with discogenic back pain only: Secondary | ICD-10-CM | POA: Diagnosis not present

## 2024-09-22 DIAGNOSIS — M25552 Pain in left hip: Secondary | ICD-10-CM | POA: Diagnosis not present

## 2024-09-22 DIAGNOSIS — M9903 Segmental and somatic dysfunction of lumbar region: Secondary | ICD-10-CM | POA: Diagnosis not present

## 2024-09-22 DIAGNOSIS — M9905 Segmental and somatic dysfunction of pelvic region: Secondary | ICD-10-CM | POA: Diagnosis not present

## 2024-09-22 DIAGNOSIS — M5137 Other intervertebral disc degeneration, lumbosacral region with discogenic back pain only: Secondary | ICD-10-CM | POA: Diagnosis not present

## 2024-09-23 DIAGNOSIS — M9905 Segmental and somatic dysfunction of pelvic region: Secondary | ICD-10-CM | POA: Diagnosis not present

## 2024-09-23 DIAGNOSIS — M25552 Pain in left hip: Secondary | ICD-10-CM | POA: Diagnosis not present

## 2024-09-23 DIAGNOSIS — M9903 Segmental and somatic dysfunction of lumbar region: Secondary | ICD-10-CM | POA: Diagnosis not present

## 2024-09-23 DIAGNOSIS — M5137 Other intervertebral disc degeneration, lumbosacral region with discogenic back pain only: Secondary | ICD-10-CM | POA: Diagnosis not present

## 2024-09-25 DIAGNOSIS — M25552 Pain in left hip: Secondary | ICD-10-CM | POA: Diagnosis not present

## 2024-09-25 DIAGNOSIS — M9905 Segmental and somatic dysfunction of pelvic region: Secondary | ICD-10-CM | POA: Diagnosis not present

## 2024-09-25 DIAGNOSIS — M9903 Segmental and somatic dysfunction of lumbar region: Secondary | ICD-10-CM | POA: Diagnosis not present

## 2024-09-25 DIAGNOSIS — M5137 Other intervertebral disc degeneration, lumbosacral region with discogenic back pain only: Secondary | ICD-10-CM | POA: Diagnosis not present

## 2024-09-29 DIAGNOSIS — M25552 Pain in left hip: Secondary | ICD-10-CM | POA: Diagnosis not present

## 2024-09-29 DIAGNOSIS — M5137 Other intervertebral disc degeneration, lumbosacral region with discogenic back pain only: Secondary | ICD-10-CM | POA: Diagnosis not present

## 2024-09-29 DIAGNOSIS — M9903 Segmental and somatic dysfunction of lumbar region: Secondary | ICD-10-CM | POA: Diagnosis not present

## 2024-09-29 DIAGNOSIS — M9905 Segmental and somatic dysfunction of pelvic region: Secondary | ICD-10-CM | POA: Diagnosis not present

## 2024-09-30 DIAGNOSIS — M9903 Segmental and somatic dysfunction of lumbar region: Secondary | ICD-10-CM | POA: Diagnosis not present

## 2024-09-30 DIAGNOSIS — M9905 Segmental and somatic dysfunction of pelvic region: Secondary | ICD-10-CM | POA: Diagnosis not present

## 2024-09-30 DIAGNOSIS — M5137 Other intervertebral disc degeneration, lumbosacral region with discogenic back pain only: Secondary | ICD-10-CM | POA: Diagnosis not present

## 2024-09-30 DIAGNOSIS — M25552 Pain in left hip: Secondary | ICD-10-CM | POA: Diagnosis not present

## 2024-10-02 DIAGNOSIS — M9903 Segmental and somatic dysfunction of lumbar region: Secondary | ICD-10-CM | POA: Diagnosis not present

## 2024-10-02 DIAGNOSIS — M5137 Other intervertebral disc degeneration, lumbosacral region with discogenic back pain only: Secondary | ICD-10-CM | POA: Diagnosis not present

## 2024-10-02 DIAGNOSIS — M9905 Segmental and somatic dysfunction of pelvic region: Secondary | ICD-10-CM | POA: Diagnosis not present

## 2024-10-02 DIAGNOSIS — M25552 Pain in left hip: Secondary | ICD-10-CM | POA: Diagnosis not present

## 2024-10-06 DIAGNOSIS — M5137 Other intervertebral disc degeneration, lumbosacral region with discogenic back pain only: Secondary | ICD-10-CM | POA: Diagnosis not present

## 2024-10-06 DIAGNOSIS — M9905 Segmental and somatic dysfunction of pelvic region: Secondary | ICD-10-CM | POA: Diagnosis not present

## 2024-10-06 DIAGNOSIS — M9903 Segmental and somatic dysfunction of lumbar region: Secondary | ICD-10-CM | POA: Diagnosis not present

## 2024-10-06 DIAGNOSIS — M25552 Pain in left hip: Secondary | ICD-10-CM | POA: Diagnosis not present

## 2024-10-09 DIAGNOSIS — M5137 Other intervertebral disc degeneration, lumbosacral region with discogenic back pain only: Secondary | ICD-10-CM | POA: Diagnosis not present

## 2024-10-09 DIAGNOSIS — M25552 Pain in left hip: Secondary | ICD-10-CM | POA: Diagnosis not present

## 2024-10-09 DIAGNOSIS — M9903 Segmental and somatic dysfunction of lumbar region: Secondary | ICD-10-CM | POA: Diagnosis not present

## 2024-10-09 DIAGNOSIS — M9905 Segmental and somatic dysfunction of pelvic region: Secondary | ICD-10-CM | POA: Diagnosis not present

## 2024-10-13 DIAGNOSIS — M9903 Segmental and somatic dysfunction of lumbar region: Secondary | ICD-10-CM | POA: Diagnosis not present

## 2024-10-13 DIAGNOSIS — M5137 Other intervertebral disc degeneration, lumbosacral region with discogenic back pain only: Secondary | ICD-10-CM | POA: Diagnosis not present

## 2024-10-13 DIAGNOSIS — M9905 Segmental and somatic dysfunction of pelvic region: Secondary | ICD-10-CM | POA: Diagnosis not present

## 2024-10-13 DIAGNOSIS — M25552 Pain in left hip: Secondary | ICD-10-CM | POA: Diagnosis not present

## 2024-10-20 DIAGNOSIS — M9905 Segmental and somatic dysfunction of pelvic region: Secondary | ICD-10-CM | POA: Diagnosis not present

## 2024-10-20 DIAGNOSIS — M9903 Segmental and somatic dysfunction of lumbar region: Secondary | ICD-10-CM | POA: Diagnosis not present

## 2024-10-20 DIAGNOSIS — M25552 Pain in left hip: Secondary | ICD-10-CM | POA: Diagnosis not present

## 2024-10-21 DIAGNOSIS — I10 Essential (primary) hypertension: Secondary | ICD-10-CM | POA: Diagnosis not present

## 2024-11-01 DIAGNOSIS — J189 Pneumonia, unspecified organism: Secondary | ICD-10-CM | POA: Diagnosis not present

## 2024-11-01 DIAGNOSIS — R051 Acute cough: Secondary | ICD-10-CM | POA: Diagnosis not present

## 2024-11-03 DIAGNOSIS — M5137 Other intervertebral disc degeneration, lumbosacral region with discogenic back pain only: Secondary | ICD-10-CM | POA: Diagnosis not present

## 2024-11-03 DIAGNOSIS — M9903 Segmental and somatic dysfunction of lumbar region: Secondary | ICD-10-CM | POA: Diagnosis not present

## 2024-11-03 DIAGNOSIS — M9905 Segmental and somatic dysfunction of pelvic region: Secondary | ICD-10-CM | POA: Diagnosis not present

## 2024-11-03 DIAGNOSIS — M25552 Pain in left hip: Secondary | ICD-10-CM | POA: Diagnosis not present

## 2024-11-23 DIAGNOSIS — L732 Hidradenitis suppurativa: Secondary | ICD-10-CM | POA: Diagnosis not present

## 2025-03-23 ENCOUNTER — Ambulatory Visit: Admitting: Dermatology
